# Patient Record
Sex: Male | Born: 1965
Health system: Southern US, Community
[De-identification: ages and names within clinical notes are randomized; demographics above are authoritative.]

## PROBLEM LIST (undated history)

## (undated) ENCOUNTER — Emergency Department: Discharge status: Absent without leave | Payer: BLUE CROSS/BLUE SHIELD

## (undated) DIAGNOSIS — Z973 Presence of spectacles and contact lenses: Secondary | ICD-10-CM

## (undated) DIAGNOSIS — E079 Disorder of thyroid, unspecified: Secondary | ICD-10-CM

## (undated) DIAGNOSIS — B019 Varicella without complication: Secondary | ICD-10-CM

## (undated) DIAGNOSIS — K219 Gastro-esophageal reflux disease without esophagitis: Secondary | ICD-10-CM

## (undated) DIAGNOSIS — R112 Nausea with vomiting, unspecified: Secondary | ICD-10-CM

## (undated) DIAGNOSIS — I10 Essential (primary) hypertension: Secondary | ICD-10-CM

## (undated) DIAGNOSIS — K760 Fatty (change of) liver, not elsewhere classified: Secondary | ICD-10-CM

## (undated) DIAGNOSIS — F419 Anxiety disorder, unspecified: Secondary | ICD-10-CM

## (undated) DIAGNOSIS — Z9889 Other specified postprocedural states: Secondary | ICD-10-CM

## (undated) DIAGNOSIS — Z148 Genetic carrier of other disease: Secondary | ICD-10-CM

## (undated) HISTORY — DX: Disorder of thyroid, unspecified: E07.9

## (undated) HISTORY — DX: Varicella without complication: B01.9

## (undated) HISTORY — DX: Fatty (change of) liver, not elsewhere classified: K76.0

## (undated) HISTORY — PX: WISDOM TOOTH EXTRACTION: SHX21

## (undated) HISTORY — DX: Genetic carrier of other disease: Z14.8

## (undated) HISTORY — DX: Essential (primary) hypertension: I10

---

## 2014-04-12 ENCOUNTER — Encounter: Payer: Self-pay | Admitting: Family

## 2014-04-12 ENCOUNTER — Ambulatory Visit (INDEPENDENT_AMBULATORY_CARE_PROVIDER_SITE_OTHER): Payer: BC Managed Care – PPO | Admitting: Family

## 2014-04-12 VITALS — BP 145/78 | HR 93 | Temp 98.1°F | Ht 66.0 in | Wt 188.0 lb

## 2014-04-12 DIAGNOSIS — K429 Umbilical hernia without obstruction or gangrene: Secondary | ICD-10-CM

## 2014-04-12 DIAGNOSIS — E039 Hypothyroidism, unspecified: Secondary | ICD-10-CM

## 2014-04-12 DIAGNOSIS — Z23 Encounter for immunization: Secondary | ICD-10-CM

## 2014-04-12 DIAGNOSIS — I10 Essential (primary) hypertension: Secondary | ICD-10-CM | POA: Insufficient documentation

## 2014-04-12 DIAGNOSIS — Z1283 Encounter for screening for malignant neoplasm of skin: Secondary | ICD-10-CM

## 2014-04-12 MED ORDER — FLUTICASONE PROPIONATE 50 MCG/ACT NA SUSP: 2.0000 | Freq: Every day | NASAL | Status: DC

## 2014-04-12 NOTE — Patient Instructions (Addendum)
You will be contacted about your referral to dermatology and surgery. Start flonase 2 sprays each nostril for breathing. Complete lab work prior to leaving. Schedule fasting physical at your convenience some time in the next 3 months.   Welcome to [facility]!

## 2014-04-12 NOTE — Assessment & Plan Note (Signed)
BP Readings from Last 3 Encounters:  04/12/14 145/78   BP slightly up today, will continue meds at current doses. If still above goal next visit, will need to consider adjusting meds.

## 2014-04-12 NOTE — Assessment & Plan Note (Signed)
Refer to surgeon for repair. We discussed signs/symptoms of strangulated hernia and he is advised to go to the ED if this occurs.

## 2014-04-12 NOTE — Progress Notes (Signed)
Subjective:    Patient ID: Austin Boyd, male    DOB: 31-May-1966, 48 y.o.   MRN: [id number]  HPI  Mr. [name] "Austin Boyd" is a 48 yr old male who presents today to establish care.   HTN- reports that this was diagnosed 6 years ago though was told he had htn prior to that  Hypothyroid- diagnosed about 6 years ago and has been stable on synthroid x 53mg.   Hernia- reports 2 year hx of umbilical hernia, causes him pain sometimes after he eats.  He has not met with a sPsychologist, sport and exercise    Review of Systems  Constitutional: Negative for unexpected weight change.  HENT: Positive for rhinorrhea.   Respiratory: Negative for shortness of breath.   Gastrointestinal: Negative for nausea, vomiting and diarrhea.  Genitourinary: Negative for dysuria and frequency.  Musculoskeletal: Negative for joint swelling and myalgias.  Skin: Negative for rash.  Neurological: Negative for headaches.  Hematological: Negative for adenopathy.  Psychiatric/Behavioral:       Denies depression/anxiety   Past Medical History  Diagnosis Date  . Hypertension   . Thyroid disease   . Chicken pox     History   Social History  . Marital Status: Married    Spouse Name: N/A    Number of Children: N/A  . Years of Education: N/A   Occupational History  . Not on file.   Social History Main Topics  . Smoking status: Never Smoker   . Smokeless tobacco: Not on file  . Alcohol Use: Yes     Comment: social reports 4 beers a night  . Drug Use: No  . Sexual Activity: Not on file   Other Topics Concern  . 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 No pertinent past surgical history.  Family History  Problem Relation Age of Onset  . Arthritis Mother   . Alcohol abuse Father   . Cancer Brother 40    Thyroid   . Heart  disease Maternal Grandmother   . Stroke Maternal Grandmother   . Hypertension Maternal Grandmother   . Heart disease Maternal Grandfather   . Stroke Maternal Grandfather   . Hypertension Maternal Grandfather   . Hyperlipidemia Paternal Grandmother   . Hyperlipidemia Paternal Grandfather   . Multiple sclerosis Father     died in late 518's   No Known Allergies  No current outpatient prescriptions on file prior to visit.   No current facility-administered medications on file prior to visit.    BP 145/78  Pulse 93  Temp(Src) 98.1 F (36.7 C) (Oral)  Ht _0  (1.676 m)  Wt 188 lb (85.276 kg)  BMI 30.36 kg/m2  SpO2 97%       Objective:   Physical Exam  Constitutional: He is oriented to person, place, and time. He appears well-developed and well-nourished. No distress.  HENT:  Head: Normocephalic and atraumatic.  Eyes: No scleral icterus.  Neck: No thyromegaly present.  Cardiovascular: Normal rate and regular rhythm.   No murmur heard. Pulmonary/Chest: Effort normal and breath sounds normal. No respiratory distress. He has no wheezes. He has no rales. He exhibits no tenderness.  Abdominal: Soft. Bowel sounds are normal. He exhibits no distension. There is no tenderness.  Small reducible umbilical hernia is noted  Musculoskeletal: He exhibits no edema.  Muscular appearing male  Neurological: He is alert and oriented to person, place, and time.  Skin: Skin is warm and dry.  Psychiatric: He has a normal mood and affect. His behavior is normal. Judgment and thought content normal.          Assessment & Plan:  Flu shot today.

## 2014-04-12 NOTE — Assessment & Plan Note (Signed)
Clinically stable on synthroid, continue same, obtain TSH.  

## 2014-04-12 NOTE — Progress Notes (Signed)
Pre visit review using our clinic review tool, if applicable. No additional management support is needed unless otherwise documented below in the visit note. 

## 2014-04-13 LAB — BASIC METABOLIC PANEL
BUN: 21 mg/dL (ref 6–23)
CO2: 21 mEq/L (ref 19–32)
CREATININE: 1.5 mg/dL (ref 0.4–1.5)
Calcium: 9.3 mg/dL (ref 8.4–10.5)
Chloride: 99 mEq/L (ref 96–112)
GFR: 52.21 mL/min — AB (ref >60.00)
GLUCOSE: 69 mg/dL — AB (ref 70–99)
Potassium: 3.6 mEq/L (ref 3.5–5.1)
Sodium: 137 mEq/L (ref 135–145)

## 2014-04-13 LAB — TSH: TSH: 1.62 u[IU]/mL (ref 0.35–4.50)

## 2014-04-14 ENCOUNTER — Encounter: Payer: Self-pay | Admitting: Family

## 2014-05-18 ENCOUNTER — Ambulatory Visit (INDEPENDENT_AMBULATORY_CARE_PROVIDER_SITE_OTHER): Payer: Self-pay | Admitting: Surgery

## 2014-05-18 NOTE — H&P (Signed)
History of Present Illness (Austin Boyd K. Austin Wiechman MD; 05/18/2014 11:12 AM) Patient words: umbilical hernia.  The patient is a 48 year old male who presents with an umbilical hernia. 48 yo male in good health - referred by Melissa O'Sullivan, NP for evaluation of umbilical hernia. He has had a two-year history of an enlarging umbilical hernia. Occasional discomfort. No obstructive symptoms. He presents now to discuss surgical repair. He works as a welder.   Other Problems (Sonya Bynum, CMA; 05/18/2014 10:18 AM) High blood pressure Thyroid Disease Umbilical Hernia Repair  Past Surgical History (Sonya Bynum, CMA; 05/18/2014 10:18 AM) No pertinent past surgical history  Diagnostic Studies History (Sonya Bynum, CMA; 05/18/2014 10:18 AM) Colonoscopy never  Allergies (Sonya Bynum, CMA; 05/18/2014 10:20 AM) No Known Drug Allergies12/09/2013  Medication History (Sonya Bynum, CMA; 05/18/2014 10:20 AM) Lisinopril-Hydrochlorothiazide (20-25MG Tablet, Oral) Active. AmLODIPine Besylate (2.5MG Tablet, Oral) Active. Synthroid (50MCG Tablet, Oral) Active.  Social History (Sonya Bynum, CMA; 05/18/2014 10:18 AM) Alcohol use Occasional alcohol use. Caffeine use Coffee. No drug use Tobacco use Never smoker.  Family History (Sonya Bynum, CMA; 05/18/2014 10:18 AM) Alcohol Abuse Father. Arthritis Mother. Heart disease in male family member before age 65 Thyroid problems Brother.  Review of Systems (Sonya Bynum CMA; 05/18/2014 10:18 AM) General Not Present- Appetite Loss, Chills, Fatigue, Fever, Night Sweats, Weight Gain and Weight Loss. Skin Not Present- Change in Wart/Mole, Dryness, Hives, Jaundice, New Lesions, Non-Healing Wounds, Rash and Ulcer. HEENT Present- Wears glasses/contact lenses. Not Present- Earache, Hearing Loss, Hoarseness, Nose Bleed, Oral Ulcers, Ringing in the Ears, Seasonal Allergies, Sinus Pain, Sore Throat, Visual Disturbances and Yellow Eyes. Respiratory Present-  Snoring. Not Present- Bloody sputum, Chronic Cough, Difficulty Breathing and Wheezing. Cardiovascular Not Present- Chest Pain, Difficulty Breathing Lying Down, Leg Cramps, Palpitations, Rapid Heart Rate, Shortness of Breath and Swelling of Extremities. Gastrointestinal Not Present- Abdominal Pain, Bloating, Bloody Stool, Change in Bowel Habits, Chronic diarrhea, Constipation, Difficulty Swallowing, Excessive gas, Gets full quickly at meals, Hemorrhoids, Indigestion, Nausea, Rectal Pain and Vomiting. Male Genitourinary Not Present- Blood in Urine, Change in Urinary Stream, Frequency, Impotence, Nocturia, Painful Urination, Urgency and Urine Leakage. Musculoskeletal Not Present- Back Pain, Joint Pain, Joint Stiffness, Muscle Pain, Muscle Weakness and Swelling of Extremities. Neurological Not Present- Decreased Memory, Fainting, Headaches, Numbness, Seizures, Tingling, Tremor, Trouble walking and Weakness. Psychiatric Not Present- Anxiety, Bipolar, Change in Sleep Pattern, Depression, Fearful and Frequent crying. Endocrine Not Present- Cold Intolerance, Excessive Hunger, Hair Changes, Heat Intolerance, Hot flashes and New Diabetes. Hematology Not Present- Easy Bruising, Excessive bleeding, Gland problems, HIV and Persistent Infections.   Vitals (Sonya Bynum CMA; 05/18/2014 10:19 AM) 05/18/2014 10:19 AM Weight: 186 lb Height: 66in Body Surface Area: 1.98 m Body Mass Index: 30.02 kg/m Temp.: 97.5F(Temporal)  Pulse: 78 (Regular)  BP: 134/80 (Sitting, Left Arm, Standard)    Physical Exam (Lothar Prehn K. Ariyan Brisendine MD; 05/18/2014 11:13 AM) The physical exam findings are as follows: Note:WDWN in NAD HEENT: EOMI, sclera anicteric Neck: No masses, no thyromegaly Lungs: CTA bilaterally; normal respiratory effort CV: Regular rate and rhythm; no murmurs Abd: +bowel sounds, soft, non-tender, protruding umbilcal hernia - incarcerated, but not inflamed or tender; probably contains omentum Ext:  Well-perfused; no edema Skin: Warm, dry; no sign of jaundice    Assessment & Plan (Jovi Zavadil K. Nirali Magouirk MD; 05/18/2014 10:45 AM) UMBILICAL HERNIA, INCARCERATED (552.1  K42.0) Current Plans  Schedule for Surgery - umbilical hernia repair with mesh. The surgical procedure has been discussed with the patient. Potential risks, benefits, alternative treatments, and   expected outcomes have been explained. All of the patient's questions at this time have been answered. The likelihood of reaching the patient's treatment goal is good. The patient understand the proposed surgical procedure and wishes to proceed.   Romone Shaff K. Eyoel Throgmorton, MD, FACS [facility]  General/ Trauma Surgery  05/18/2014 1:42 PM   

## 2014-06-11 ENCOUNTER — Encounter (HOSPITAL_BASED_OUTPATIENT_CLINIC_OR_DEPARTMENT_OTHER)
Admission: RE | Admit: 2014-06-11 | Discharge: 2014-06-11 | Disposition: A | Discharge status: Home / Self Care | Payer: BC Managed Care – PPO | Source: Ambulatory Visit | Attending: Surgery | Admitting: Surgery

## 2014-06-11 ENCOUNTER — Encounter (HOSPITAL_BASED_OUTPATIENT_CLINIC_OR_DEPARTMENT_OTHER): Payer: Self-pay | Admitting: *Deleted

## 2014-06-11 DIAGNOSIS — E079 Disorder of thyroid, unspecified: Secondary | ICD-10-CM | POA: Diagnosis not present

## 2014-06-11 DIAGNOSIS — K429 Umbilical hernia without obstruction or gangrene: Secondary | ICD-10-CM | POA: Diagnosis present

## 2014-06-11 DIAGNOSIS — I10 Essential (primary) hypertension: Secondary | ICD-10-CM | POA: Diagnosis not present

## 2014-06-11 LAB — BASIC METABOLIC PANEL
ANION GAP: 6 (ref 5–15)
BUN: 16 mg/dL (ref 6–23)
CALCIUM: 9.5 mg/dL (ref 8.4–10.5)
CO2: 31 mmol/L (ref 19–32)
Chloride: 102 mEq/L (ref 96–112)
Creatinine, Ser: 1.53 mg/dL — ABNORMAL HIGH (ref 0.50–1.35)
GFR calc Af Amer: 60 mL/min — ABNORMAL LOW (ref >90)
GFR, EST NON AFRICAN AMERICAN: 52 mL/min — AB (ref >90)
Glucose, Bld: 99 mg/dL (ref 70–99)
POTASSIUM: 4.5 mmol/L (ref 3.5–5.1)
SODIUM: 139 mmol/L (ref 135–145)

## 2014-06-11 NOTE — Progress Notes (Signed)
To come in for ekg-bmet-wife states he is fearful needles May get a preop sedative dos

## 2014-06-13 ENCOUNTER — Ambulatory Visit (HOSPITAL_BASED_OUTPATIENT_CLINIC_OR_DEPARTMENT_OTHER): Payer: BC Managed Care – PPO | Admitting: Anesthesiology

## 2014-06-13 ENCOUNTER — Ambulatory Visit (HOSPITAL_BASED_OUTPATIENT_CLINIC_OR_DEPARTMENT_OTHER)
Admission: RE | Admit: 2014-06-13 | Discharge: 2014-06-13 | Disposition: A | Discharge status: Home / Self Care | Payer: BC Managed Care – PPO | Source: Ambulatory Visit | Attending: Surgery | Admitting: Surgery

## 2014-06-13 ENCOUNTER — Encounter (HOSPITAL_BASED_OUTPATIENT_CLINIC_OR_DEPARTMENT_OTHER): Payer: Self-pay | Admitting: *Deleted

## 2014-06-13 ENCOUNTER — Encounter (HOSPITAL_BASED_OUTPATIENT_CLINIC_OR_DEPARTMENT_OTHER)
Admission: RE | Disposition: A | Discharge status: Home / Self Care | Payer: Self-pay | Source: Ambulatory Visit | Attending: Surgery

## 2014-06-13 DIAGNOSIS — K429 Umbilical hernia without obstruction or gangrene: Secondary | ICD-10-CM | POA: Diagnosis not present

## 2014-06-13 DIAGNOSIS — I10 Essential (primary) hypertension: Secondary | ICD-10-CM | POA: Insufficient documentation

## 2014-06-13 DIAGNOSIS — E079 Disorder of thyroid, unspecified: Secondary | ICD-10-CM | POA: Insufficient documentation

## 2014-06-13 HISTORY — DX: Nausea with vomiting, unspecified: R11.2

## 2014-06-13 HISTORY — DX: Anxiety disorder, unspecified: F41.9

## 2014-06-13 HISTORY — PX: UMBILICAL HERNIA REPAIR: SHX196

## 2014-06-13 HISTORY — DX: Presence of spectacles and contact lenses: Z97.3

## 2014-06-13 HISTORY — DX: Nausea with vomiting, unspecified: Z98.890

## 2014-06-13 HISTORY — PX: INSERTION OF MESH: SHX5868

## 2014-06-13 HISTORY — DX: Gastro-esophageal reflux disease without esophagitis: K21.9

## 2014-06-13 LAB — POCT HEMOGLOBIN-HEMACUE: HEMOGLOBIN: 16.8 g/dL (ref 13.0–17.0)

## 2014-06-13 SURGERY — REPAIR, HERNIA, UMBILICAL, ADULT
Anesthesia: General | Site: Abdomen

## 2014-06-13 MED ORDER — FENTANYL CITRATE 0.05 MG/ML IJ SOLN
INTRAMUSCULAR | Status: AC
  Filled 2014-06-13: qty 4

## 2014-06-13 MED ORDER — HYDROMORPHONE HCL 1 MG/ML IJ SOLN
0.2500 mg | INTRAMUSCULAR | Status: DC | PRN
  Administered 2014-06-13: 0.5 mg via INTRAVENOUS

## 2014-06-13 MED ORDER — CHLORHEXIDINE GLUCONATE 4 % EX LIQD: 1.0000 "application " | Freq: Once | CUTANEOUS | Status: DC

## 2014-06-13 MED ORDER — LACTATED RINGERS IV SOLN
INTRAVENOUS | Status: DC
  Administered 2014-06-13 (×2): via INTRAVENOUS
  Administered 2014-06-13: 10 mL/h via INTRAVENOUS

## 2014-06-13 MED ORDER — HYDROMORPHONE HCL 1 MG/ML IJ SOLN
INTRAMUSCULAR | Status: AC
  Filled 2014-06-13: qty 1

## 2014-06-13 MED ORDER — BUPIVACAINE-EPINEPHRINE 0.25% -1:200000 IJ SOLN
INTRAMUSCULAR | Status: DC | PRN
  Administered 2014-06-13: 10 mL

## 2014-06-13 MED ORDER — ONDANSETRON HCL 4 MG/2ML IJ SOLN
INTRAMUSCULAR | Status: DC | PRN
  Administered 2014-06-13: 4 mg via INTRAVENOUS

## 2014-06-13 MED ORDER — FENTANYL CITRATE 0.05 MG/ML IJ SOLN
INTRAMUSCULAR | Status: DC | PRN
  Administered 2014-06-13: 50 ug via INTRAVENOUS
  Administered 2014-06-13 (×2): 25 ug via INTRAVENOUS
  Administered 2014-06-13: 100 ug via INTRAVENOUS

## 2014-06-13 MED ORDER — DEXAMETHASONE SODIUM PHOSPHATE 4 MG/ML IJ SOLN
INTRAMUSCULAR | Status: DC | PRN
  Administered 2014-06-13: 10 mg via INTRAVENOUS

## 2014-06-13 MED ORDER — CEFAZOLIN SODIUM 1-5 GM-% IV SOLN
INTRAVENOUS | Status: AC
  Filled 2014-06-13: qty 100

## 2014-06-13 MED ORDER — MIDAZOLAM HCL 5 MG/5ML IJ SOLN
INTRAMUSCULAR | Status: DC | PRN
  Administered 2014-06-13: 2 mg via INTRAVENOUS

## 2014-06-13 MED ORDER — ONDANSETRON HCL 4 MG/2ML IJ SOLN: 4.0000 mg | INTRAMUSCULAR | Status: DC | PRN

## 2014-06-13 MED ORDER — OXYCODONE-ACETAMINOPHEN 5-325 MG PO TABS
ORAL_TABLET | ORAL | Status: AC
  Filled 2014-06-13: qty 1

## 2014-06-13 MED ORDER — OXYCODONE-ACETAMINOPHEN 5-325 MG PO TABS: 1.0000 | ORAL_TABLET | ORAL | Status: DC | PRN

## 2014-06-13 MED ORDER — MIDAZOLAM HCL 2 MG/2ML IJ SOLN
INTRAMUSCULAR | Status: AC
  Filled 2014-06-13: qty 2

## 2014-06-13 MED ORDER — MORPHINE SULFATE 2 MG/ML IJ SOLN: 2.0000 mg | INTRAMUSCULAR | Status: DC | PRN

## 2014-06-13 MED ORDER — MIDAZOLAM HCL 2 MG/2ML IJ SOLN: 1.0000 mg | INTRAMUSCULAR | Status: DC | PRN

## 2014-06-13 MED ORDER — LIDOCAINE HCL (CARDIAC) 20 MG/ML IV SOLN
INTRAVENOUS | Status: DC | PRN
  Administered 2014-06-13: 80 mg via INTRAVENOUS

## 2014-06-13 MED ORDER — CEFAZOLIN SODIUM-DEXTROSE 2-3 GM-% IV SOLR
2.0000 g | INTRAVENOUS | Status: AC
  Administered 2014-06-13: 2 g via INTRAVENOUS

## 2014-06-13 MED ORDER — PROMETHAZINE HCL 25 MG/ML IJ SOLN: 6.2500 mg | INTRAMUSCULAR | Status: DC | PRN

## 2014-06-13 MED ORDER — PROPOFOL 10 MG/ML IV BOLUS
INTRAVENOUS | Status: DC | PRN
  Administered 2014-06-13: 200 mg via INTRAVENOUS

## 2014-06-13 MED ORDER — FENTANYL CITRATE 0.05 MG/ML IJ SOLN: 50.0000 ug | INTRAMUSCULAR | Status: DC | PRN

## 2014-06-13 MED ORDER — OXYCODONE-ACETAMINOPHEN 5-325 MG PO TABS
1.0000 | ORAL_TABLET | ORAL | Status: DC | PRN
  Administered 2014-06-13: 1 via ORAL

## 2014-06-13 SURGICAL SUPPLY — 62 items
APPLICATOR COTTON TIP 6IN STRL (MISCELLANEOUS) IMPLANT
BENZOIN TINCTURE PRP APPL 2/3 (GAUZE/BANDAGES/DRESSINGS) ×3 IMPLANT
BLADE CLIPPER SURG (BLADE) ×3 IMPLANT
BLADE HEX COATED 2.75 (ELECTRODE) ×3 IMPLANT
BLADE SURG 15 STRL LF DISP TIS (BLADE) ×1 IMPLANT
BLADE SURG 15 STRL SS (BLADE) ×2
CANISTER SUCT 1200ML W/VALVE (MISCELLANEOUS) IMPLANT
CHLORAPREP W/TINT 26ML (MISCELLANEOUS) ×3 IMPLANT
CLOSURE WOUND 1/2 X4 (GAUZE/BANDAGES/DRESSINGS) ×1
COVER BACK TABLE 60X90IN (DRAPES) ×3 IMPLANT
COVER MAYO STAND STRL (DRAPES) ×3 IMPLANT
DECANTER SPIKE VIAL GLASS SM (MISCELLANEOUS) IMPLANT
DRAPE LAPAROTOMY T 102X78X121 (DRAPES) IMPLANT
DRAPE PED LAPAROTOMY (DRAPES) ×3 IMPLANT
DRAPE UTILITY XL STRL (DRAPES) ×3 IMPLANT
DRSG TEGADERM 4X4.75 (GAUZE/BANDAGES/DRESSINGS) ×3 IMPLANT
ELECT REM PT RETURN 9FT ADLT (ELECTROSURGICAL) ×3
ELECTRODE REM PT RTRN 9FT ADLT (ELECTROSURGICAL) ×1 IMPLANT
GLOVE BIO SURGEON STRL SZ7 (GLOVE) ×3 IMPLANT
GLOVE BIOGEL PI IND STRL 7.0 (GLOVE) ×2 IMPLANT
GLOVE BIOGEL PI IND STRL 7.5 (GLOVE) ×1 IMPLANT
GLOVE BIOGEL PI INDICATOR 7.0 (GLOVE) ×4
GLOVE BIOGEL PI INDICATOR 7.5 (GLOVE) ×2
GLOVE ECLIPSE 6.5 STRL STRAW (GLOVE) ×3 IMPLANT
GLOVE EXAM NITRILE LRG STRL (GLOVE) ×3 IMPLANT
GOWN STRL REUS W/ TWL LRG LVL3 (GOWN DISPOSABLE) ×3 IMPLANT
GOWN STRL REUS W/TWL LRG LVL3 (GOWN DISPOSABLE) ×6
MESH VENTRALEX ST 1-7/10 CRC S (Mesh General) ×3 IMPLANT
NDL SAFETY ECLIPSE 18X1.5 (NEEDLE) IMPLANT
NEEDLE HYPO 18GX1.5 SHARP (NEEDLE)
NEEDLE HYPO 25X1 1.5 SAFETY (NEEDLE) ×3 IMPLANT
NS IRRIG 1000ML POUR BTL (IV SOLUTION) IMPLANT
PACK BASIN DAY SURGERY FS (CUSTOM PROCEDURE TRAY) ×3 IMPLANT
PENCIL BUTTON HOLSTER BLD 10FT (ELECTRODE) ×3 IMPLANT
SLEEVE SCD COMPRESS KNEE MED (MISCELLANEOUS) ×3 IMPLANT
SPONGE GAUZE 2X2 8PLY STER LF (GAUZE/BANDAGES/DRESSINGS) ×1
SPONGE GAUZE 2X2 8PLY STRL LF (GAUZE/BANDAGES/DRESSINGS) ×2 IMPLANT
SPONGE GAUZE 4X4 12PLY STER LF (GAUZE/BANDAGES/DRESSINGS) IMPLANT
STAPLER VISISTAT 35W (STAPLE) IMPLANT
STRIP CLOSURE SKIN 1/2X4 (GAUZE/BANDAGES/DRESSINGS) ×2 IMPLANT
SUT MNCRL AB 4-0 PS2 18 (SUTURE) ×3 IMPLANT
SUT NOVA NAB DX-16 0-1 5-0 T12 (SUTURE) ×3 IMPLANT
SUT NOVA NAB GS-21 0 18 T12 DT (SUTURE) ×3 IMPLANT
SUT PDS AB 0 CT 36 (SUTURE) IMPLANT
SUT PROLENE 0 CT 1 30 (SUTURE) IMPLANT
SUT PROLENE 0 CT 1 CR/8 (SUTURE) IMPLANT
SUT SILK 3 0 TIES 17X18 (SUTURE)
SUT SILK 3-0 18XBRD TIE BLK (SUTURE) IMPLANT
SUT VIC AB 2-0 CT1 27 (SUTURE)
SUT VIC AB 2-0 CT1 TAPERPNT 27 (SUTURE) IMPLANT
SUT VIC AB 3-0 SH 27 (SUTURE) ×2
SUT VIC AB 3-0 SH 27X BRD (SUTURE) ×1 IMPLANT
SUT VIC AB 4-0 BRD 54 (SUTURE) IMPLANT
SUT VIC AB 4-0 SH 18 (SUTURE) IMPLANT
SUT VICRYL 4-0 PS2 18IN ABS (SUTURE) IMPLANT
SYR BULB 3OZ (MISCELLANEOUS) IMPLANT
SYR CONTROL 10ML LL (SYRINGE) ×3 IMPLANT
TOWEL OR 17X24 6PK STRL BLUE (TOWEL DISPOSABLE) ×3 IMPLANT
TOWEL OR NON WOVEN STRL DISP B (DISPOSABLE) IMPLANT
TUBE CONNECTING 20'X1/4 (TUBING)
TUBE CONNECTING 20X1/4 (TUBING) IMPLANT
YANKAUER SUCT BULB TIP NO VENT (SUCTIONS) IMPLANT

## 2014-06-13 NOTE — Op Note (Signed)
Indications:  The patient presented with a history of an enlarging, non-reducible umbilical hernia.  The patient was examined and we recommended umbilical hernia repair with mesh.  Pre-operative diagnosis:  Umbilical hernia  Post-operative diagnosis:  Same  Surgeon: Alexi Dorminey K.   Assistants: none  Anesthesia: General LMA anesthesia  ASA Class: 1   Procedure Details  The patient was seen again in the Holding Room. The risks, benefits, complications, treatment options, and expected outcomes were discussed with the patient. The possibilities of reaction to medication, pulmonary aspiration, perforation of viscus, bleeding, recurrent infection, the need for additional procedures, and development of a complication requiring transfusion or further operation were discussed with the patient and/or family. There was concurrence with the proposed plan, and informed consent was obtained. The site of surgery was properly noted/marked. The patient was taken to the Operating Room, identified as Austin Boyd, and the procedure verified as umbilical hernia repair. A Time Out was held and the above information confirmed.  After an adequate level of general anesthesia was obtained, the patient's abdomen was prepped with Chloraprep and draped in sterile fashion.  We made a transverse incision above the umbilicus.  Dissection was carried down to the hernia sac with cautery.  We dissected bluntly around the hernia sac down to the edge of the fascial defect.  We reduced the hernia sac back into the pre-peritoneal space.  The fascial defect measured 1.5 cm.  We cleared the fascia in all directions.  A small Ventralex mesh was inserted into the pre-peritoneal space and was deployed.  The mesh was secured with four trans-fascial sutures of 0 Novofil.  The fascial defect was closed with multiple interrupted figure-of-eight 1 Novofil sutures.  The base of the umbilicus was tacked down with 3-0 Vicryl.  3-0 Vicryl was  used to close the subcutaneous tissues and 4-0 Monocryl was used to close the skin.  Steri-strips and clean dressing were applied.  The patient was extubated and brought to the recovery room in stable condition.  All sponge, instrument, and needle counts were correct prior to closure and at the conclusion of the case.   Estimated Blood Loss: Minimal          Complications: None; patient tolerated the procedure well.         Disposition: PACU - hemodynamically stable.         Condition: stable   Austin Burn. Georgette Dover, MD, [facility]  General/ Trauma Surgery  06/13/2014 11:45 AM

## 2014-06-13 NOTE — Anesthesia Preprocedure Evaluation (Signed)
Anesthesia Evaluation  Patient identified by MRN, date of birth, ID band Patient awake    Reviewed: Allergy & Precautions, H&P , NPO status , Patient's Chart, lab work & pertinent test results  History of Anesthesia Complications (+) PONV  Airway    Neck ROM: Full    Dental  (+) Teeth Intact   Pulmonary neg pulmonary ROS,  breath sounds clear to auscultation        Cardiovascular hypertension, Rhythm:Regular     Neuro/Psych negative neurological ROS     GI/Hepatic GERD-  ,  Endo/Other  Hypothyroidism   Renal/GU      Musculoskeletal   Abdominal   Peds  Hematology   Anesthesia Other Findings   Reproductive/Obstetrics                             Anesthesia Physical Anesthesia Plan  ASA: II  Anesthesia Plan: General   Post-op Pain Management:    Induction: Intravenous  Airway Management Planned: LMA  Additional Equipment:   Intra-op Plan:   Post-operative Plan: Extubation in OR  Informed Consent: I have reviewed the patients History and Physical, chart, labs and discussed the procedure including the risks, benefits and alternatives for the proposed anesthesia with the patient or authorized representative who has indicated his/her understanding and acceptance.   Dental advisory given  Plan Discussed with: CRNA and Surgeon  Anesthesia Plan Comments:         Anesthesia Quick Evaluation

## 2014-06-13 NOTE — Discharge Instructions (Signed)
[facility] ° °UMBILICAL HERNIA REPAIR: POST OP INSTRUCTIONS ° °Always review your discharge instruction sheet given to you by the facility where your surgery was performed. °IF YOU HAVE DISABILITY OR FAMILY LEAVE FORMS, YOU MUST BRING THEM TO THE OFFICE FOR PROCESSING.   °DO NOT GIVE THEM TO YOUR DOCTOR. ° °1. A  prescription for pain medication may be given to you upon discharge.  Take your pain medication as prescribed, if needed.  If narcotic pain medicine is not needed, then you may take acetaminophen (Tylenol) or ibuprofen (Advil) as needed. °2. Take your usually prescribed medications unless otherwise directed. °3. If you need a refill on your pain medication, please contact your pharmacy.  They will contact our office to request authorization. Prescriptions will not be filled after 5 pm or on week-ends. °4. You should follow a light diet the first 24 hours after arrival home, such as soup and crackers, etc.  Be sure to include lots of fluids daily.  Resume your normal diet the day after surgery. °5. Most patients will experience some swelling and bruising around the umbilicus or in the groin and scrotum.  Ice packs and reclining will help.  Swelling and bruising can take several days to resolve.  °6. It is common to experience some constipation if taking pain medication after surgery.  Increasing fluid intake and taking a stool softener (such as Colace) will usually help or prevent this problem from occurring.  A mild laxative (Milk of Magnesia or Miralax) should be taken according to package directions if there are no bowel movements after 48 hours. °7. Unless discharge instructions indicate otherwise, you may remove your bandages 24-48 hours after surgery, and you may shower at that time.  You will have steri-strips (small skin tapes) in place directly over the incision.  These strips should be left on the skin for 7-10 days. °8. ACTIVITIES:  You may resume regular (light) daily activities  beginning the next day--such as daily self-care, walking, climbing stairs--gradually increasing activities as tolerated.  You may have sexual intercourse when it is comfortable.  Refrain from any heavy lifting or straining until approved by your doctor. °a. You may drive when you are no longer taking prescription pain medication, you can comfortably wear a seatbelt, and you can safely maneuver your car and apply brakes. °b. RETURN TO WORK:  2-3 weeks with light duty - no lifting over 15 lbs. °9. You should see your doctor in the office for a follow-up appointment approximately 2-3 weeks after your surgery.  Make sure that you call for this appointment within a day or two after you arrive home to insure a convenient appointment time. °10. OTHER INSTRUCTIONS:  __________________________________________________________________________________________________________________________________________________________________________________________  °WHEN TO CALL YOUR DOCTOR: °1. Fever over 101.0 °2. Inability to urinate °3. Nausea and/or vomiting °4. Extreme swelling or bruising °5. Continued bleeding from incision. °6. Increased pain, redness, or drainage from the incision ° °The clinic staff is available to answer your questions during regular business hours.  Please don’t hesitate to call and ask to speak to one of the nurses for clinical concerns.  If you have a medical emergency, go to the nearest emergency room or call 911.  A surgeon from [facility] is always on call at the hospital ° ° °[address], [address], [place], [place]  [address] ? ° [address] [address], [place], [place]   [address] °[phone]    [phone]    FAX [phone] °Web site: [email or web address] ° ° °  Post Anesthesia Home Care Instructions ° °Activity: °Get plenty of rest for the remainder of the day. A responsible adult should stay with you for 24 hours following the procedure.  °For the next 24 hours, DO  NOT: °-Drive a car °-Operate machinery °-Drink alcoholic beverages °-Take any medication unless instructed by your physician °-Make any legal decisions or sign important papers. ° °Meals: °Start with liquid foods such as gelatin or soup. Progress to regular foods as tolerated. Avoid greasy, spicy, heavy foods. If nausea and/or vomiting occur, drink only clear liquids until the nausea and/or vomiting subsides. Call your physician if vomiting continues. ° °Special Instructions/Symptoms: °Your throat may feel dry or sore from the anesthesia or the breathing tube placed in your throat during surgery. If this causes discomfort, gargle with warm salt water. The discomfort should disappear within 24 hours. ° °

## 2014-06-13 NOTE — Interval H&P Note (Signed)
History and Physical Interval Note:  06/13/2014 8:38 AM  Austin Boyd  has presented today for surgery, with the diagnosis of Umbilical Hernia  The various methods of treatment have been discussed with the patient and family. After consideration of risks, benefits and other options for treatment, the patient has consented to  Procedure(s): HERNIA REPAIR UMBILICAL ADULT (N/A) INSERTION OF MESH (N/A) as a surgical intervention .  The patient's history has been reviewed, patient examined, no change in status, stable for surgery.  I have reviewed the patient's chart and labs.  Questions were answered to the patient's satisfaction.     [name] K.

## 2014-06-13 NOTE — Transfer of Care (Signed)
Immediate Anesthesia Transfer of Care Note  Patient: Austin Boyd  Procedure(s) Performed: Procedure(s): HERNIA REPAIR UMBILICAL ADULT (N/A) INSERTION OF MESH (N/A)  Patient Location: PACU  Anesthesia Type:General  Level of Consciousness: sedated  Airway & Oxygen Therapy: Patient Spontanous Breathing and Patient connected to face mask oxygen  Post-op Assessment: Report given to PACU RN and Post -op Vital signs reviewed and stable  Post vital signs: Reviewed and stable  Complications: No apparent anesthesia complications

## 2014-06-13 NOTE — Anesthesia Procedure Notes (Signed)
Procedure Name: LMA Insertion Date/Time: 06/13/2014 11:06 AM Performed by: Maryella Shivers Pre-anesthesia Checklist: Patient identified, Emergency Drugs available, Suction available and Patient being monitored Patient Re-evaluated:Patient Re-evaluated prior to inductionOxygen Delivery Method: Circle System Utilized Preoxygenation: Pre-oxygenation with 100% oxygen Intubation Type: IV induction Ventilation: Mask ventilation without difficulty LMA: LMA inserted LMA Size: 5.0 Number of attempts: 1 Airway Equipment and Method: bite block Placement Confirmation: positive ETCO2 Tube secured with: Tape Dental Injury: Teeth and Oropharynx as per pre-operative assessment

## 2014-06-13 NOTE — H&P (View-Only) (Signed)
History of Present Illness Austin Boyd. Charnell Peplinski MD; 05/18/2014 11:12 AM) Patient words: umbilical hernia.  The patient is a 48 year old male who presents with an umbilical hernia. 48 yo male in good health - referred by Debbrah Alar, NP for evaluation of umbilical hernia. He has had a two-year history of an enlarging umbilical hernia. Occasional discomfort. No obstructive symptoms. He presents now to discuss surgical repair. He works as a Building control surveyor.   Other Problems Davy Pique Bynum, CMA; 05/18/2014 10:18 AM) High blood pressure Thyroid Disease Umbilical Hernia Repair  Past Surgical History Marjean Donna, CMA; 05/18/2014 10:18 AM) No pertinent past surgical history  Diagnostic Studies History Marjean Donna, CMA; 05/18/2014 10:18 AM) Colonoscopy never  Allergies (Sonya Bynum, CMA; 05/18/2014 10:20 AM) No Known Drug Allergies12/09/2013  Medication History (Sonya Bynum, CMA; 05/18/2014 10:20 AM) Lisinopril-Hydrochlorothiazide (20-25MG  Tablet, Oral) Active. AmLODIPine Besylate (2.5MG  Tablet, Oral) Active. Synthroid (50MCG Tablet, Oral) Active.  Social History ([place]; 05/18/2014 10:18 AM) Alcohol use Occasional alcohol use. Caffeine use Coffee. No drug use Tobacco use Never smoker.  Family History Marjean Donna, CMA; 05/18/2014 10:18 AM) Alcohol Abuse Father. Arthritis Mother. Heart disease in male family member before age 25 Thyroid problems Brother.  Review of Systems Davy Pique Bynum CMA; 05/18/2014 10:18 AM) General Not Present- Appetite Loss, Chills, Fatigue, Fever, Night Sweats, Weight Gain and Weight Loss. Skin Not Present- Change in Wart/Mole, Dryness, Hives, Jaundice, New Lesions, Non-Healing Wounds, Rash and Ulcer. HEENT Present- Wears glasses/contact lenses. Not Present- Earache, Hearing Loss, Hoarseness, Nose Bleed, Oral Ulcers, Ringing in the Ears, Seasonal Allergies, Sinus Pain, Sore Throat, Visual Disturbances and Yellow Eyes. Respiratory Present-  Snoring. Not Present- Bloody sputum, Chronic Cough, Difficulty Breathing and Wheezing. Cardiovascular Not Present- Chest Pain, Difficulty Breathing Lying Down, Leg Cramps, Palpitations, Rapid Heart Rate, Shortness of Breath and Swelling of Extremities. Gastrointestinal Not Present- Abdominal Pain, Bloating, Bloody Stool, Change in Bowel Habits, Chronic diarrhea, Constipation, Difficulty Swallowing, Excessive gas, Gets full quickly at meals, Hemorrhoids, Indigestion, Nausea, Rectal Pain and Vomiting. Male Genitourinary Not Present- Blood in Urine, Change in Urinary Stream, Frequency, Impotence, Nocturia, Painful Urination, Urgency and Urine Leakage. Musculoskeletal Not Present- Back Pain, Joint Pain, Joint Stiffness, Muscle Pain, Muscle Weakness and Swelling of Extremities. Neurological Not Present- Decreased Memory, Fainting, Headaches, Numbness, Seizures, Tingling, Tremor, Trouble walking and Weakness. Psychiatric Not Present- Anxiety, Bipolar, Change in Sleep Pattern, Depression, Fearful and Frequent crying. Endocrine Not Present- Cold Intolerance, Excessive Hunger, Hair Changes, Heat Intolerance, Hot flashes and New Diabetes. Hematology Not Present- Easy Bruising, Excessive bleeding, Gland problems, HIV and Persistent Infections.   Vitals (Sonya Bynum CMA; 05/18/2014 10:19 AM) 05/18/2014 10:19 AM Weight: 186 lb Height: 66in Body Surface Area: 1.98 m Body Mass Index: 30.02 kg/m Temp.: 97.52F(Temporal)  Pulse: 78 (Regular)  BP: 134/80 (Sitting, Left Arm, Standard)    Physical Exam Rodman Key K. Deepti Gunawan MD; 05/18/2014 11:13 AM) The physical exam findings are as follows: Note:WDWN in NAD HEENT: EOMI, sclera anicteric Neck: No masses, no thyromegaly Lungs: CTA bilaterally; normal respiratory effort CV: Regular rate and rhythm; no murmurs Abd: +bowel sounds, soft, non-tender, protruding umbilcal hernia - incarcerated, but not inflamed or tender; probably contains omentum Ext:  Well-perfused; no edema Skin: Warm, dry; no sign of jaundice    Assessment & Plan Rodman Key K. Greysen Swanton MD; 28/08/6627 47:65 AM) UMBILICAL HERNIA, INCARCERATED (552.1  K42.0) Current Plans  Schedule for Surgery - umbilical hernia repair with mesh. The surgical procedure has been discussed with the patient. Potential risks, benefits, alternative treatments, and  expected outcomes have been explained. All of the patient's questions at this time have been answered. The likelihood of reaching the patient's treatment goal is good. The patient understand the proposed surgical procedure and wishes to proceed.   Austin Boyd. Georgette Dover, MD, [facility]  General/ Trauma Surgery  05/18/2014 1:42 PM

## 2014-06-13 NOTE — Anesthesia Postprocedure Evaluation (Signed)
  Anesthesia Post-op Note  Patient: Austin Boyd  Procedure(s) Performed: Procedure(s): HERNIA REPAIR UMBILICAL ADULT (N/A) INSERTION OF MESH (N/A)  Patient Location: PACU  Anesthesia Type: General   Level of Consciousness: awake, alert  and oriented  Airway and Oxygen Therapy: Patient Spontanous Breathing  Post-op Pain: mild  Post-op Assessment: Post-op Vital signs reviewed  Post-op Vital Signs: Reviewed  Last Vitals:  Filed Vitals:   06/13/14 1345  BP: 149/91  Pulse: 68  Temp: 36.7 C  Resp: 16    Complications: No apparent anesthesia complications

## 2014-09-19 ENCOUNTER — Telehealth: Payer: Self-pay | Admitting: Family

## 2014-09-19 NOTE — Telephone Encounter (Signed)
Caller name: Rulon Relation to pt: self Call back number: [phone] Pharmacy: [facility]  Reason for call:   Patient is requesting lisinopril, amlodipine, synthroid refills. 90 day supply

## 2014-09-20 NOTE — Telephone Encounter (Signed)
Patient last seen 04/12/14 and note says return in 3 months for CPE.   Please see below and advise.

## 2014-09-21 MED ORDER — LISINOPRIL-HYDROCHLOROTHIAZIDE 20-25 MG PO TABS: 1.0000 | ORAL_TABLET | Freq: Every day | ORAL | Status: DC

## 2014-09-21 MED ORDER — LEVOTHYROXINE SODIUM 50 MCG PO TABS: 50.0000 ug | ORAL_TABLET | Freq: Every day | ORAL | Status: DC

## 2014-09-21 MED ORDER — AMLODIPINE BESYLATE 2.5 MG PO TABS: 2.5000 mg | ORAL_TABLET | Freq: Every day | ORAL | Status: DC

## 2014-09-21 NOTE — Telephone Encounter (Signed)
Patient wife called back stating that patient is now out. Requesting emergency supply of all meds to be sent to [facility] [address] in [facility].

## 2014-09-21 NOTE — Telephone Encounter (Signed)
30 day supply sent to pharmacy as pt's BP was slightly elevated last visit and he was advised to return in 3 months for CPE and BP recheck. Notified pt's spouse and she will have him check his calendar and call back to schedule appt.

## 2014-09-27 ENCOUNTER — Telehealth: Payer: Self-pay | Admitting: Family

## 2014-09-27 NOTE — Telephone Encounter (Signed)
Pre visit letter sent  °

## 2014-10-15 ENCOUNTER — Ambulatory Visit (INDEPENDENT_AMBULATORY_CARE_PROVIDER_SITE_OTHER): Payer: BLUE CROSS/BLUE SHIELD | Admitting: Family

## 2014-10-15 ENCOUNTER — Encounter: Payer: Self-pay | Admitting: Family

## 2014-10-15 VITALS — BP 146/90 | HR 86 | Temp 98.5°F | Resp 16 | Ht 66.0 in | Wt 187.4 lb

## 2014-10-15 DIAGNOSIS — Z0000 Encounter for general adult medical examination without abnormal findings: Secondary | ICD-10-CM | POA: Diagnosis not present

## 2014-10-15 DIAGNOSIS — Z23 Encounter for immunization: Secondary | ICD-10-CM

## 2014-10-15 DIAGNOSIS — I10 Essential (primary) hypertension: Secondary | ICD-10-CM

## 2014-10-15 MED ORDER — LEVOTHYROXINE SODIUM 50 MCG PO TABS: 50.0000 ug | ORAL_TABLET | Freq: Every day | ORAL | Status: DC

## 2014-10-15 MED ORDER — AMLODIPINE BESYLATE 5 MG PO TABS: 5.0000 mg | ORAL_TABLET | Freq: Every day | ORAL | Status: DC

## 2014-10-15 MED ORDER — LISINOPRIL-HYDROCHLOROTHIAZIDE 20-25 MG PO TABS: 1.0000 | ORAL_TABLET | Freq: Every day | ORAL | Status: DC

## 2014-10-15 NOTE — Patient Instructions (Signed)
Please schedule lab visit at the front desk. Try to add 30 min of cardio 5 days a week. Increase amlodipine from 2.5 mg to 5 mg once daily. Follow up in 1 month so we can recheck  Your blood pressure.

## 2014-10-15 NOTE — Progress Notes (Signed)
Subjective:    Patient ID: Austin Boyd, male    DOB: Oct 27, 1965, 49 y.o.   MRN: [id number]  HPI  Austin Boyd is a 49 yr old male who presents today for cpx.   Immunizations: due for tetanus Diet:  Reports healthy diet.  Exercise: reports that he works out daily- no cardio g Dental: up to date Eye: had this year  HTN- reports + compliance with amlodipine.  BP Readings from Last 3 Encounters:  10/15/14 146/90  06/13/14 149/91  04/12/14 145/78      Review of Systems  Constitutional: Negative for unexpected weight change.  HENT: Positive for rhinorrhea. Negative for hearing loss.   Respiratory: Negative for cough and shortness of breath.   Cardiovascular: Negative for chest pain and leg swelling.  Gastrointestinal: Negative for nausea, vomiting and diarrhea.  Genitourinary: Negative for dysuria and frequency.  Musculoskeletal: Negative for joint swelling and arthralgias.  Skin: Negative for rash.  Neurological: Negative for headaches.  Hematological: Negative for adenopathy.  Psychiatric/Behavioral:       Denies depression/anxiety       Past Medical History  Diagnosis Date  . Hypertension   . Thyroid disease   . Chicken pox   . Wears contact lenses   . Anxiety     scared of needles  . GERD (gastroesophageal reflux disease)   . PONV (postoperative nausea and vomiting)     History   Social History  . Marital Status: Married    Spouse Name: N/A  . Number of Children: N/A  . Years of Education: N/A   Occupational History  . Not on file.   Social History Main Topics  . Smoking status: Never Smoker   . Smokeless tobacco: Not on file  . Alcohol Use: Yes     Comment: social reports 4 beers a night  . Drug Use: No  . Sexual Activity: Not on file   Other Topics Concern  . 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 Wisdom tooth extraction    . Umbilical hernia repair N/A 06/13/2014    Procedure: HERNIA REPAIR UMBILICAL ADULT;  Surgeon: Donnie Mesa, MD;  Location: [place];  Service: General;  Laterality: N/A;  . Insertion of mesh N/A 06/13/2014    Procedure: INSERTION OF MESH;  Surgeon: Donnie Mesa, MD;  Location: [place];  Service: General;  Laterality: N/A;    Family History  Problem Relation Age of Onset  . Arthritis Mother   . Alcohol abuse Father   . Cancer Brother 40    Thyroid   . Heart disease Maternal Grandmother   . Stroke Maternal Grandmother   . Hypertension Maternal Grandmother   . Heart disease Maternal Grandfather   . Stroke Maternal Grandfather   . Hypertension Maternal Grandfather   . Hyperlipidemia Paternal Grandmother   . Hyperlipidemia Paternal Grandfather   . Multiple sclerosis Father     died in late 50's    No Known Allergies  Current Outpatient Prescriptions on File Prior to Visit  Medication Sig Dispense Refill  . amLODipine (NORVASC) 2.5 MG tablet Take 1 tablet (2.5 mg total) by mouth daily. 30 tablet 0  .  fluticasone (FLONASE) 50 MCG/ACT nasal spray Place 2 sprays into both nostrils daily. 16 g 6  . lansoprazole (PREVACID) 30 MG capsule Take 30 mg by mouth daily at 12 noon.    Marland Kitchen levothyroxine (SYNTHROID) 50 MCG tablet Take 1 tablet (50 mcg total) by mouth daily. 30 tablet 0  . lisinopril-hydrochlorothiazide (PRINZIDE,ZESTORETIC) 20-25 MG per tablet Take 1 tablet by mouth daily. 30 tablet 0   No current facility-administered medications on file prior to visit.    BP 146/90 mmHg  Pulse 86  Temp(Src) 98.5 F (36.9 C) (Oral)  Resp 16  Ht 5\' 6"  (1.676 m)  Wt 187 lb 6.4 oz (85.004 kg)  BMI 30.26 kg/m2  SpO2 98%    Objective:   Physical Exam  Physical Exam  Constitutional: Muscular white male, He is oriented to person,  place, and time. He appears well-developed and well-nourished. No distress.  HENT:  Head: Normocephalic and atraumatic.  Right Ear: Tympanic membrane and ear canal normal.  Left Ear: Tympanic membrane and ear canal normal.  Mouth/Throat: Oropharynx is clear and moist.  Eyes: Pupils are equal, round, and reactive to light. No scleral icterus.  Neck: Normal range of motion. No thyromegaly present.  Cardiovascular: Normal rate and regular rhythm.   No murmur heard. Pulmonary/Chest: Effort normal and breath sounds normal. No respiratory distress. He has no wheezes. He has no rales. He exhibits no tenderness.  Abdominal: Soft. Bowel sounds are normal. He exhibits no distension and no mass. There is no tenderness. There is no rebound and no guarding.  Musculoskeletal: He exhibits no edema.  Lymphadenopathy:    He has no cervical adenopathy.  Neurological: He is alert and oriented to person, place, and time. He has normal patellar reflexes. He exhibits normal muscle tone. Coordination normal.  Skin: Skin is warm and dry.  Psychiatric: He has a normal mood and affect. His behavior is normal. Judgment and thought content normal.          Assessment & Plan:         Assessment & Plan:

## 2014-10-15 NOTE — Progress Notes (Signed)
Pre visit review using our clinic review tool, if applicable. No additional management support is needed unless otherwise documented below in the visit note. 

## 2014-10-15 NOTE — Assessment & Plan Note (Signed)
Discussed adding cardio to his exercise routine.  Obtain fasting labs. Tdap today.

## 2014-10-17 NOTE — Assessment & Plan Note (Signed)
Uncontrolled, increase amlodipine from 2.5 mg to 5mg  once daily.

## 2014-10-18 ENCOUNTER — Telehealth: Payer: Self-pay | Admitting: Family

## 2014-10-18 DIAGNOSIS — I444 Left anterior fascicular block: Secondary | ICD-10-CM

## 2014-10-18 NOTE — Telephone Encounter (Signed)
See my chart message

## 2014-10-19 ENCOUNTER — Encounter: Payer: Self-pay | Admitting: Family

## 2014-10-20 ENCOUNTER — Other Ambulatory Visit: Payer: Self-pay | Admitting: Family

## 2014-10-22 ENCOUNTER — Other Ambulatory Visit: Payer: BLUE CROSS/BLUE SHIELD

## 2014-10-22 LAB — TSH: TSH: 2.47 u[IU]/mL (ref 0.35–4.50)

## 2014-10-22 LAB — CBC WITH DIFFERENTIAL/PLATELET
BASOS ABS: 0 10*3/uL (ref 0.0–0.1)
Basophils Relative: 0.7 % (ref 0.0–3.0)
Eosinophils Absolute: 0.1 10*3/uL (ref 0.0–0.7)
Eosinophils Relative: 3.1 % (ref 0.0–5.0)
HCT: 50.9 % (ref 39.0–52.0)
Hemoglobin: 17.7 g/dL — ABNORMAL HIGH (ref 13.0–17.0)
LYMPHS PCT: 22.8 % (ref 12.0–46.0)
Lymphs Abs: 1.1 10*3/uL (ref 0.7–4.0)
MCHC: 34.7 g/dL (ref 30.0–36.0)
MCV: 89.4 fl (ref 78.0–100.0)
MONO ABS: 0.5 10*3/uL (ref 0.1–1.0)
Monocytes Relative: 11.1 % (ref 3.0–12.0)
NEUTROS PCT: 62.3 % (ref 43.0–77.0)
Neutro Abs: 2.9 10*3/uL (ref 1.4–7.7)
Platelets: 230 10*3/uL (ref 150.0–400.0)
RBC: 5.7 Mil/uL (ref 4.22–5.81)
RDW: 13.5 % (ref 11.5–15.5)
WBC: 4.7 10*3/uL (ref 4.0–10.5)

## 2014-10-22 LAB — URINALYSIS, ROUTINE W REFLEX MICROSCOPIC
Hgb urine dipstick: NEGATIVE
LEUKOCYTES UA: NEGATIVE
Nitrite: NEGATIVE
RBC / HPF: NONE SEEN (ref 0–?)
SPECIFIC GRAVITY, URINE: 1.015 (ref 1.000–1.030)
Total Protein, Urine: NEGATIVE
UROBILINOGEN UA: 0.2 (ref 0.0–1.0)
Urine Glucose: NEGATIVE
WBC, UA: NONE SEEN (ref 0–?)
pH: 6.5 (ref 5.0–8.0)

## 2014-10-22 LAB — BASIC METABOLIC PANEL
BUN: 17 mg/dL (ref 6–23)
CALCIUM: 9.4 mg/dL (ref 8.4–10.5)
CO2: 30 mEq/L (ref 19–32)
CREATININE: 1.36 mg/dL (ref 0.40–1.50)
Chloride: 101 mEq/L (ref 96–112)
GFR: 59.23 mL/min — AB (ref >60.00)
Glucose, Bld: 101 mg/dL — ABNORMAL HIGH (ref 70–99)
Potassium: 4 mEq/L (ref 3.5–5.1)
Sodium: 136 mEq/L (ref 135–145)

## 2014-10-22 LAB — HEPATIC FUNCTION PANEL
ALBUMIN: 4 g/dL (ref 3.5–5.2)
ALK PHOS: 42 U/L (ref 39–117)
ALT: 86 U/L — ABNORMAL HIGH (ref 0–53)
AST: 66 U/L — ABNORMAL HIGH (ref 0–37)
BILIRUBIN TOTAL: 0.8 mg/dL (ref 0.2–1.2)
Bilirubin, Direct: 0.2 mg/dL (ref 0.0–0.3)
Total Protein: 7 g/dL (ref 6.0–8.3)

## 2014-10-22 LAB — LIPID PANEL
CHOL/HDL RATIO: 3
Cholesterol: 175 mg/dL (ref 0–200)
HDL: 52 mg/dL (ref >39.00)
LDL CALC: 110 mg/dL — AB (ref 0–99)
NONHDL: 123
Triglycerides: 65 mg/dL (ref 0.0–149.0)
VLDL: 13 mg/dL (ref 0.0–40.0)

## 2014-10-23 ENCOUNTER — Encounter: Payer: Self-pay | Admitting: Family

## 2014-10-23 ENCOUNTER — Telehealth: Payer: Self-pay | Admitting: Family

## 2014-10-23 DIAGNOSIS — R945 Abnormal results of liver function studies: Secondary | ICD-10-CM

## 2014-10-23 DIAGNOSIS — K760 Fatty (change of) liver, not elsewhere classified: Secondary | ICD-10-CM | POA: Insufficient documentation

## 2014-10-23 DIAGNOSIS — R7989 Other specified abnormal findings of blood chemistry: Secondary | ICD-10-CM

## 2014-10-23 NOTE — Telephone Encounter (Signed)
Liver function testing is elevated.  How much alcohol does he use and how often? I would recommend that he complete US of abdomen to evaluate for fatty liver and also complete lab work as below.  Cholesterol and thyroid look good.

## 2014-10-23 NOTE — Telephone Encounter (Signed)
Notified pt and he voices understanding. U/s abd scheduled for 10/31/14 at 10:30am. He will complete labs at 8:30am on 5/18. Future orders signed. Pt states that he drinks 2 mixed drinks and 4-5 cans beer every night.

## 2014-10-23 NOTE — Telephone Encounter (Signed)
Left message with pt's wife to have pt return my call.

## 2014-10-24 ENCOUNTER — Encounter: Payer: Self-pay | Admitting: Family

## 2014-10-24 NOTE — Telephone Encounter (Signed)
Alcohol could be contributing factor to his abnormal LFT's. Advise pt that he should cut back on alcohol (ideally eliminate) however, definitely keep to 2 or less drinks/day.

## 2014-10-24 NOTE — Telephone Encounter (Signed)
Notified pt's wife  

## 2014-10-31 ENCOUNTER — Other Ambulatory Visit (INDEPENDENT_AMBULATORY_CARE_PROVIDER_SITE_OTHER): Payer: BLUE CROSS/BLUE SHIELD

## 2014-10-31 ENCOUNTER — Ambulatory Visit (HOSPITAL_BASED_OUTPATIENT_CLINIC_OR_DEPARTMENT_OTHER)
Admission: RE | Admit: 2014-10-31 | Discharge: 2014-10-31 | Disposition: A | Discharge status: Home / Self Care | Payer: BLUE CROSS/BLUE SHIELD | Source: Ambulatory Visit | Attending: Family | Admitting: Family

## 2014-10-31 ENCOUNTER — Telehealth: Payer: Self-pay | Admitting: Family

## 2014-10-31 DIAGNOSIS — R9431 Abnormal electrocardiogram [ECG] [EKG]: Secondary | ICD-10-CM | POA: Insufficient documentation

## 2014-10-31 DIAGNOSIS — I444 Left anterior fascicular block: Secondary | ICD-10-CM

## 2014-10-31 DIAGNOSIS — R945 Abnormal results of liver function studies: Secondary | ICD-10-CM

## 2014-10-31 DIAGNOSIS — R7989 Other specified abnormal findings of blood chemistry: Secondary | ICD-10-CM | POA: Diagnosis not present

## 2014-10-31 LAB — HEPATITIS PANEL, ACUTE
HCV AB: NEGATIVE
HEP B S AG: NEGATIVE
Hep A IgM: NONREACTIVE
Hep B C IgM: NONREACTIVE

## 2014-10-31 LAB — FERRITIN: FERRITIN: 194.5 ng/mL (ref 22.0–322.0)

## 2014-10-31 NOTE — Progress Notes (Signed)
  Echocardiogram 2D Echocardiogram has been performed.  Austin Boyd 10/31/2014, 9:39 AM

## 2014-10-31 NOTE — Telephone Encounter (Signed)
Dr. Sallyanne Kuster,  Thanks for reading Austin Boyd's echo.  Do you have a summary/impression available on this report please? thanks

## 2014-10-31 NOTE — Telephone Encounter (Signed)
That was a novel Emergency planning/management officer. Think I fixed it. Please let me know if report still missing summary

## 2014-11-01 ENCOUNTER — Encounter: Payer: Self-pay | Admitting: Family

## 2015-02-04 MED ORDER — HYDROMORPHONE HCL 1 MG/ML IJ SOLN
INTRAMUSCULAR | Status: DC
Start: 2015-02-04 — End: 2015-02-04
  Filled 2015-02-04: qty 1

## 2015-02-04 MED ORDER — METOCLOPRAMIDE HCL 5 MG/ML IJ SOLN
INTRAMUSCULAR | Status: AC
  Filled 2015-02-04: qty 2

## 2015-02-04 MED ORDER — DIPHENHYDRAMINE HCL 50 MG/ML IJ SOLN
INTRAMUSCULAR | Status: AC
  Filled 2015-02-04: qty 1

## 2015-02-21 ENCOUNTER — Encounter: Payer: Self-pay | Admitting: Family

## 2015-05-26 ENCOUNTER — Other Ambulatory Visit: Payer: Self-pay | Admitting: Family

## 2015-05-27 MED ORDER — LISINOPRIL-HYDROCHLOROTHIAZIDE 20-25 MG PO TABS: 1.0000 | ORAL_TABLET | Freq: Every day | ORAL | Status: DC

## 2015-05-27 MED ORDER — LEVOTHYROXINE SODIUM 50 MCG PO TABS: 50.0000 ug | ORAL_TABLET | Freq: Every day | ORAL | Status: DC

## 2015-05-27 NOTE — Telephone Encounter (Signed)
Lisinopril and levothyroxine refills denied to [facility]. Pt last seen 10/15/14 and advised 1 month follow up of BP. Pt is past due. I sent 30 day supply of each to local [facility]. Please call pt to arrange appt. With Melissa soon. Thanks!

## 2015-05-28 NOTE — Telephone Encounter (Signed)
LM for pt to call and schedule f/u within 30 days. Also notified that 30 day supply was sent to local [facility] not [facility].

## 2015-06-07 ENCOUNTER — Ambulatory Visit: Payer: BLUE CROSS/BLUE SHIELD | Admitting: Family

## 2015-06-24 ENCOUNTER — Ambulatory Visit: Payer: BLUE CROSS/BLUE SHIELD | Admitting: Family

## 2015-07-01 ENCOUNTER — Ambulatory Visit (INDEPENDENT_AMBULATORY_CARE_PROVIDER_SITE_OTHER): Payer: BLUE CROSS/BLUE SHIELD | Admitting: Family

## 2015-07-01 ENCOUNTER — Encounter: Payer: Self-pay | Admitting: Family

## 2015-07-01 VITALS — BP 143/82 | HR 87 | Temp 99.0°F | Resp 18 | Ht 66.0 in | Wt 185.8 lb

## 2015-07-01 DIAGNOSIS — I10 Essential (primary) hypertension: Secondary | ICD-10-CM

## 2015-07-01 DIAGNOSIS — H938X2 Other specified disorders of left ear: Secondary | ICD-10-CM | POA: Diagnosis not present

## 2015-07-01 DIAGNOSIS — K760 Fatty (change of) liver, not elsewhere classified: Secondary | ICD-10-CM | POA: Diagnosis not present

## 2015-07-01 DIAGNOSIS — E039 Hypothyroidism, unspecified: Secondary | ICD-10-CM | POA: Diagnosis not present

## 2015-07-01 MED ORDER — CEPHALEXIN 500 MG PO CAPS: 500.0000 mg | ORAL_CAPSULE | Freq: Three times a day (TID) | ORAL | Status: DC

## 2015-07-01 MED ORDER — LEVOTHYROXINE SODIUM 50 MCG PO TABS: 50.0000 ug | ORAL_TABLET | Freq: Every day | ORAL | Status: DC

## 2015-07-01 MED ORDER — AMLODIPINE BESYLATE 10 MG PO TABS
10.0000 mg | ORAL_TABLET | Freq: Every day | ORAL | Status: DC
Start: 2015-07-01 — End: 2015-08-05

## 2015-07-01 MED ORDER — LISINOPRIL-HYDROCHLOROTHIAZIDE 20-25 MG PO TABS: 1.0000 | ORAL_TABLET | Freq: Every day | ORAL | Status: DC

## 2015-07-01 MED FILL — LISINOPRIL-HCTZ 20-25 MG TA: 20-25 | 30 days supply | Qty: 30 | Fill #0

## 2015-07-01 MED FILL — LEVOTHYROXINE 50 MCG TABLET: 50 | 30 days supply | Qty: 30 | Fill #0

## 2015-07-01 MED FILL — CEPHALEXIN 500 MG CAPSULE: 500 | 7 days supply | Qty: 21 | Fill #0

## 2015-07-01 MED FILL — AMLODIPINE BESYLATE 10 MG T: 10 | 30 days supply | Qty: 30 | Fill #0

## 2015-07-01 NOTE — Assessment & Plan Note (Signed)
BP above goal. Continue lisinopril/hctz, increase amlodipine from 5mg  to 10mg . Obtain bmet.

## 2015-07-01 NOTE — Progress Notes (Signed)
Subjective:    Patient ID: Austin Boyd, male    DOB: [birth year], 50 y.o.   MRN: [id number]  HPI   Austin Boyd is a 50 yr old male who presents today for follow up.  HTN- Currently maintained on lisinopril hctz.   BP Readings from Last 3 Encounters:  07/01/15 143/82  10/15/14 146/90  06/13/14 149/91   Hypothyroid- maintained on synthroid 50mg .  Lab Results  Component Value Date   TSH 2.47 10/22/2014   Skin Problem- Patient reports bump on left ear x 2 weeks.  Reports that the area became irritated by welding helmet.   Review of Systems See HPI  Past Medical History  Diagnosis Date  . Hypertension   . Thyroid disease   . Chicken pox   . Wears contact lenses   . Anxiety     scared of needles  . GERD (gastroesophageal reflux disease)   . PONV (postoperative nausea and vomiting)     Social History   Social History  . Marital Status: Married    Spouse Name: N/A  . Number of Children: N/A  . Years of Education: N/A   Occupational History  . Not on file.   Social History Main Topics  . Smoking status: Never Smoker   . Smokeless tobacco: Not on file  . Alcohol Use: Yes     Comment: social reports 4 beers a night  . Drug Use: No  . Sexual Activity: Not on file   Other Topics Concern  . 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 Wisdom tooth extraction    . Umbilical hernia repair N/A 06/13/2014    Procedure: HERNIA REPAIR UMBILICAL ADULT;  Surgeon: Donnie Mesa, MD;  Location: [place];  Service: General;  Laterality: N/A;  . Insertion of mesh N/A 06/13/2014    Procedure: INSERTION OF MESH;  Surgeon: Donnie Mesa, MD;  Location: [place];  Service: General;  Laterality: N/A;    Family History   Problem Relation Age of Onset  . Arthritis Mother   . Alcohol abuse Father   . Cancer Brother 40    Thyroid   . Heart disease Maternal Grandmother   . Stroke Maternal Grandmother   . Hypertension Maternal Grandmother   . Heart disease Maternal Grandfather   . Stroke Maternal Grandfather   . Hypertension Maternal Grandfather   . Hyperlipidemia Paternal Grandmother   . Hyperlipidemia Paternal Grandfather   . Multiple sclerosis Father     died in late 66's    No Known Allergies  Current Outpatient Prescriptions on File Prior to Visit  Medication Sig Dispense Refill  . lansoprazole (PREVACID) 30 MG capsule Take 30 mg by mouth daily at 12 noon.     No current facility-administered medications on file prior to visit.    BP 143/82 mmHg  Pulse 87  Temp(Src) 99 F (37.2 C) (Oral)  Resp 18  Ht 5\' 6"  (1.676 m)  Wt 185 lb 12.8 oz (84.278 kg)  BMI 30.00 kg/m2  SpO2 97%       Objective:   Physical Exam  Constitutional: He is oriented to person, place, and time. He appears well-developed and well-nourished.  No distress.  HENT:  Head: Normocephalic and atraumatic.    + firm induration left upper inner pinna, no obvious fluctuance    Cardiovascular: Normal rate and regular rhythm.   No murmur heard. Pulmonary/Chest: Effort normal and breath sounds normal. No respiratory distress. He has no wheezes. He has no rales.  Musculoskeletal: He exhibits no edema.  Neurological: He is alert and oriented to person, place, and time.  Skin: Skin is warm and dry.  Psychiatric: He has a normal mood and affect. His behavior is normal. Thought content normal.          Assessment & Plan:  Ear swelling left ear-  Trial of Keflex for possible abscess. Advised pt as follows:   Call if ear becomes more swollen, red or if it begins to drain. Call if swelling is not improved in 1 week.

## 2015-07-01 NOTE — Patient Instructions (Signed)
Start Keflex (antibiotic) for your ear. Call if ear becomes more swollen, red or if it begins to drain. Call if swelling is not improved in 1 week. Complete lab work prior to leaving. Increase amlodipine from 5 mg to 10mg .

## 2015-07-01 NOTE — Progress Notes (Signed)
Pre visit review using our clinic review tool, if applicable. No additional management support is needed unless otherwise documented below in the visit note. 

## 2015-07-01 NOTE — Assessment & Plan Note (Signed)
Clinically stable, obtain tsh, continue synthroid.  

## 2015-07-01 NOTE — Assessment & Plan Note (Signed)
Discussed low fat/low cholesterol diet.  

## 2015-07-02 ENCOUNTER — Encounter: Payer: Self-pay | Admitting: Family

## 2015-07-02 LAB — BASIC METABOLIC PANEL
BUN: 22 mg/dL (ref 6–23)
CO2: 32 mEq/L (ref 19–32)
Calcium: 9.8 mg/dL (ref 8.4–10.5)
Chloride: 99 mEq/L (ref 96–112)
Creatinine, Ser: 1.45 mg/dL (ref 0.40–1.50)
GFR: 54.85 mL/min — ABNORMAL LOW (ref >60.00)
Glucose, Bld: 98 mg/dL (ref 70–99)
Potassium: 3.9 mEq/L (ref 3.5–5.1)
Sodium: 138 mEq/L (ref 135–145)

## 2015-07-02 LAB — TSH: TSH: 3.48 u[IU]/mL (ref 0.35–4.50)

## 2015-07-09 ENCOUNTER — Telehealth: Payer: Self-pay | Admitting: Family

## 2015-07-09 ENCOUNTER — Emergency Department (HOSPITAL_BASED_OUTPATIENT_CLINIC_OR_DEPARTMENT_OTHER)
Admission: EM | Admit: 2015-07-09 | Discharge: 2015-07-09 | Disposition: A | Discharge status: Home / Self Care | Payer: BLUE CROSS/BLUE SHIELD | Attending: Emergency Medicine | Admitting: Emergency Medicine

## 2015-07-09 ENCOUNTER — Emergency Department (HOSPITAL_BASED_OUTPATIENT_CLINIC_OR_DEPARTMENT_OTHER): Payer: BLUE CROSS/BLUE SHIELD

## 2015-07-09 ENCOUNTER — Encounter (HOSPITAL_BASED_OUTPATIENT_CLINIC_OR_DEPARTMENT_OTHER): Payer: Self-pay

## 2015-07-09 DIAGNOSIS — Z973 Presence of spectacles and contact lenses: Secondary | ICD-10-CM | POA: Insufficient documentation

## 2015-07-09 DIAGNOSIS — Z8619 Personal history of other infectious and parasitic diseases: Secondary | ICD-10-CM | POA: Diagnosis not present

## 2015-07-09 DIAGNOSIS — Z792 Long term (current) use of antibiotics: Secondary | ICD-10-CM | POA: Diagnosis not present

## 2015-07-09 DIAGNOSIS — E079 Disorder of thyroid, unspecified: Secondary | ICD-10-CM | POA: Diagnosis not present

## 2015-07-09 DIAGNOSIS — M25572 Pain in left ankle and joints of left foot: Secondary | ICD-10-CM | POA: Diagnosis present

## 2015-07-09 DIAGNOSIS — R7989 Other specified abnormal findings of blood chemistry: Secondary | ICD-10-CM | POA: Diagnosis not present

## 2015-07-09 DIAGNOSIS — K219 Gastro-esophageal reflux disease without esophagitis: Secondary | ICD-10-CM | POA: Insufficient documentation

## 2015-07-09 DIAGNOSIS — Z8659 Personal history of other mental and behavioral disorders: Secondary | ICD-10-CM | POA: Insufficient documentation

## 2015-07-09 DIAGNOSIS — Z79899 Other long term (current) drug therapy: Secondary | ICD-10-CM | POA: Insufficient documentation

## 2015-07-09 DIAGNOSIS — R600 Localized edema: Secondary | ICD-10-CM | POA: Diagnosis not present

## 2015-07-09 LAB — CBC WITH DIFFERENTIAL/PLATELET
Basophils Absolute: 0 10*3/uL (ref 0.0–0.1)
Basophils Relative: 0 %
Eosinophils Absolute: 0.1 10*3/uL (ref 0.0–0.7)
Eosinophils Relative: 1 %
HCT: 48.5 % (ref 39.0–52.0)
Hemoglobin: 16.7 g/dL (ref 13.0–17.0)
Lymphocytes Relative: 20 %
Lymphs Abs: 1.3 10*3/uL (ref 0.7–4.0)
MCH: 30.6 pg (ref 26.0–34.0)
MCHC: 34.4 g/dL (ref 30.0–36.0)
MCV: 88.8 fL (ref 78.0–100.0)
Monocytes Absolute: 0.7 10*3/uL (ref 0.1–1.0)
Monocytes Relative: 10 %
Neutro Abs: 4.5 10*3/uL (ref 1.7–7.7)
Neutrophils Relative %: 69 %
Platelets: 195 10*3/uL (ref 150–400)
RBC: 5.46 MIL/uL (ref 4.22–5.81)
RDW: 12.6 % (ref 11.5–15.5)
WBC: 6.6 10*3/uL (ref 4.0–10.5)

## 2015-07-09 LAB — URIC ACID: URIC ACID, SERUM: 5.9 mg/dL (ref 4.4–7.6)

## 2015-07-09 LAB — BASIC METABOLIC PANEL
Anion gap: 10 (ref 5–15)
BUN: 28 mg/dL — AB (ref 6–20)
CALCIUM: 9.4 mg/dL (ref 8.9–10.3)
CHLORIDE: 98 mmol/L — AB (ref 101–111)
CO2: 26 mmol/L (ref 22–32)
CREATININE: 1.54 mg/dL — AB (ref 0.61–1.24)
GFR calc non Af Amer: 51 mL/min — ABNORMAL LOW (ref >60)
GFR, EST AFRICAN AMERICAN: 60 mL/min — AB (ref >60)
Glucose, Bld: 106 mg/dL — ABNORMAL HIGH (ref 65–99)
Potassium: 3.9 mmol/L (ref 3.5–5.1)
SODIUM: 134 mmol/L — AB (ref 135–145)

## 2015-07-09 LAB — URINALYSIS, ROUTINE W REFLEX MICROSCOPIC
BILIRUBIN URINE: NEGATIVE
Glucose, UA: NEGATIVE mg/dL
HGB URINE DIPSTICK: NEGATIVE
KETONES UR: NEGATIVE mg/dL
Leukocytes, UA: NEGATIVE
NITRITE: NEGATIVE
Protein, ur: NEGATIVE mg/dL
Specific Gravity, Urine: 1.015 (ref 1.005–1.030)
pH: 6.5 (ref 5.0–8.0)

## 2015-07-09 NOTE — ED Notes (Signed)
MD James at bedside.  

## 2015-07-09 NOTE — Discharge Instructions (Signed)
Continue your medications at the current dosage. Increase your fluid intake. Your kidney function has slightly deteriorated. Follow up with your primary care physician within one week to recheck this as your medications were recently changed.   Edema Edema is an abnormal buildup of fluids in your bodytissues. Edema is somewhatdependent on gravity to pull the fluid to the lowest place in your body. That makes the condition more common in the legs and thighs (lower extremities). Painless swelling of the feet and ankles is common and becomes more likely as you get older. It is also common in looser tissues, like around your eyes.  When the affected area is squeezed, the fluid may move out of that spot and leave a dent for a few moments. This dent is called pitting.  CAUSES  There are many possible causes of edema. Eating too much salt and being on your feet or sitting for a long time can cause edema in your legs and ankles. Hot weather may make edema worse. Common medical causes of edema include:  Heart failure.  Liver disease.  Kidney disease.  Weak blood vessels in your legs.  Cancer.  An injury.  Pregnancy.  Some medications.  Obesity. SYMPTOMS  Edema is usually painless.Your skin may look swollen or shiny.  DIAGNOSIS  Your health care provider may be able to diagnose edema by asking about your medical history and doing a physical exam. You may need to have tests such as X-rays, an electrocardiogram, or blood tests to check for medical conditions that may cause edema.  TREATMENT  Edema treatment depends on the cause. If you have heart, liver, or kidney disease, you need the treatment appropriate for these conditions. General treatment may include:  Elevation of the affected body part above the level of your heart.  Compression of the affected body part. Pressure from elastic bandages or support stockings squeezes the tissues and forces fluid back into the blood vessels. This  keeps fluid from entering the tissues.  Restriction of fluid and salt intake.  Use of a water pill (diuretic). These medications are appropriate only for some types of edema. They pull fluid out of your body and make you urinate more often. This gets rid of fluid and reduces swelling, but diuretics can have side effects. Only use diuretics as directed by your health care provider. HOME CARE INSTRUCTIONS   Keep the affected body part above the level of your heart when you are lying down.   Do not sit still or stand for prolonged periods.   Do not put anything directly under your knees when lying down.  Do not wear constricting clothing or garters on your upper legs.   Exercise your legs to work the fluid back into your blood vessels. This may help the swelling go down.   Wear elastic bandages or support stockings to reduce ankle swelling as directed by your health care provider.   Eat a low-salt diet to reduce fluid if your health care provider recommends it.   Only take medicines as directed by your health care provider. SEEK MEDICAL CARE IF:   Your edema is not responding to treatment.  You have heart, liver, or kidney disease and notice symptoms of edema.  You have edema in your legs that does not improve after elevating them.   You have sudden and unexplained weight gain. SEEK IMMEDIATE MEDICAL CARE IF:   You develop shortness of breath or chest pain.   You cannot breathe when you lie down.  You develop pain, redness, or warmth in the swollen areas.   You have heart, liver, or kidney disease and suddenly get edema.  You have a fever and your symptoms suddenly get worse. MAKE SURE YOU:   Understand these instructions.  Will watch your condition.  Will get help right away if you are not doing well or get worse.   This information is not intended to replace advice given to you by your health care provider. Make sure you discuss any questions you have with  your health care provider.   Document Released: 06/01/2005 Document Revised: 06/22/2014 Document Reviewed: 03/24/2013 Elsevier Interactive Patient Education [employer].

## 2015-07-09 NOTE — ED Notes (Signed)
Pt ambulated to US at this time.

## 2015-07-09 NOTE — ED Provider Notes (Signed)
CSN: [id number]     Arrival date & time 07/09/15  1621 History   First MD Initiated Contact with Patient 07/09/15 1635     Chief Complaint  Patient presents with  . Ankle Pain      HPI  She presents for evaluation of ankle pain. He states that one point that his ankle is painful at another point he states that he felt like his leg was tightly went to scratch his leg. He was concerned that he was placed on a new medication by his primary care physician in the day. Actually this was an increase in his dose of his lisinopril/HCTZ dosage. He has an infection on his ear and is on antibiotic for this. He is walking without limp. Has not had any prolonged immobilization. No history of blood clot.  Past Medical History  Diagnosis Date  . Hypertension   . Thyroid disease   . Chicken pox   . Wears contact lenses   . Anxiety     scared of needles  . GERD (gastroesophageal reflux disease)   . PONV (postoperative nausea and vomiting)    Past Surgical History  Procedure Laterality Date  . Wisdom tooth extraction    . Umbilical hernia repair N/A 06/13/2014    Procedure: HERNIA REPAIR UMBILICAL ADULT;  Surgeon: Donnie Mesa, MD;  Location: [place];  Service: General;  Laterality: N/A;  . Insertion of mesh N/A 06/13/2014    Procedure: INSERTION OF MESH;  Surgeon: Donnie Mesa, MD;  Location: [place];  Service: General;  Laterality: N/A;   Family History  Problem Relation Age of Onset  . Arthritis Mother   . Alcohol abuse Father   . Cancer Brother 40    Thyroid   . Heart disease Maternal Grandmother   . Stroke Maternal Grandmother   . Hypertension Maternal Grandmother   . Heart disease Maternal Grandfather   . Stroke Maternal Grandfather   . Hypertension Maternal Grandfather   . Hyperlipidemia Paternal Grandmother   . Hyperlipidemia Paternal Grandfather   . Multiple sclerosis Father     died in late 57's   Social History  Substance Use Topics  .  Smoking status: Never Smoker   . Smokeless tobacco: None  . Alcohol Use: No     Comment: social reports 4 beers a night    Review of Systems  Constitutional: Negative for fever, chills, diaphoresis, appetite change and fatigue.  HENT: Negative for mouth sores, sore throat and trouble swallowing.   Eyes: Negative for visual disturbance.  Respiratory: Negative for cough, chest tightness, shortness of breath and wheezing.   Cardiovascular: Negative for chest pain.  Gastrointestinal: Negative for nausea, vomiting, abdominal pain, diarrhea and abdominal distention.  Endocrine: Negative for polydipsia, polyphagia and polyuria.  Genitourinary: Negative for dysuria, frequency and hematuria.  Musculoskeletal: Negative for gait problem.  Skin: Negative for color change, pallor and rash.       Left lower leg pain. Points near his ankle and states that he feels like it "looks and feels swollen".  Neurological: Negative for dizziness, syncope, light-headedness and headaches.  Hematological: Does not bruise/bleed easily.  Psychiatric/Behavioral: Negative for behavioral problems and confusion.      Allergies  Review of patient's allergies indicates no known allergies.  Home Medications   Prior to Admission medications   Medication Sig Start Date End Date Taking? Authorizing Provider  amLODipine (NORVASC) 10 MG tablet Take 1 tablet (10 mg total) by mouth daily. 07/01/15  Debbrah Alar, NP  cephALEXin (KEFLEX) 500 MG capsule Take 1 capsule (500 mg total) by mouth 3 (three) times daily. 07/01/15   Debbrah Alar, NP  lansoprazole (PREVACID) 30 MG capsule Take 30 mg by mouth daily at 12 noon.    Historical Provider, MD  levothyroxine (SYNTHROID) 50 MCG tablet Take 1 tablet (50 mcg total) by mouth daily. 07/01/15   Debbrah Alar, NP  lisinopril-hydrochlorothiazide (PRINZIDE,ZESTORETIC) 20-25 MG tablet Take 1 tablet by mouth daily. 07/01/15   Debbrah Alar, NP   BP 158/96 mmHg   Pulse 87  Temp(Src) 98.3 F (36.8 C) (Oral)  Resp 18  Ht 5\' 7"  (1.702 m)  Wt 185 lb (83.915 kg)  BMI 28.97 kg/m2  SpO2 97% Physical Exam  Constitutional: He is oriented to person, place, and time. He appears well-developed and well-nourished. No distress.  HENT:  Head: Normocephalic.  Eyes: Conjunctivae are normal. Pupils are equal, round, and reactive to light. No scleral icterus.  Neck: Normal range of motion. Neck supple. No thyromegaly present.  Cardiovascular: Normal rate and regular rhythm.  Exam reveals no gallop and no friction rub.   No murmur heard. Pulmonary/Chest: Effort normal and breath sounds normal. No respiratory distress. He has no wheezes. He has no rales.  Abdominal: Soft. Bowel sounds are normal. He exhibits no distension. There is no tenderness. There is no rebound.  Musculoskeletal: Normal range of motion.  Neurological: He is alert and oriented to person, place, and time.  Skin: Skin is warm and dry. No rash noted.     Psychiatric: He has a normal mood and affect. His behavior is normal.    ED Course  Procedures (including critical care time) Labs Review Labs Reviewed  BASIC METABOLIC PANEL - Abnormal; Notable for the following:    Sodium 134 (*)    Chloride 98 (*)    Glucose, Bld 106 (*)    BUN 28 (*)    Creatinine, Ser 1.54 (*)    GFR calc non Af Amer 51 (*)    GFR calc Af Amer 60 (*)    All other components within normal limits  CBC WITH DIFFERENTIAL/PLATELET  URINALYSIS, ROUTINE W REFLEX MICROSCOPIC (NOT AT [facility])  URIC ACID    Imaging Review US Venous Img Lower Bilateral  07/09/2015  CLINICAL DATA:  Bilateral lower extremity edema for 2 days. EXAM: BILATERAL LOWER EXTREMITY VENOUS DOPPLER ULTRASOUND TECHNIQUE: Gray-scale sonography with graded compression, as well as color Doppler and duplex ultrasound were performed to evaluate the lower extremity deep venous systems from the level of the common femoral vein and including the common femoral,  femoral, profunda femoral, popliteal and calf veins including the posterior tibial, peroneal and gastrocnemius veins when visible. The superficial great saphenous vein was also interrogated. Spectral Doppler was utilized to evaluate flow at rest and with distal augmentation maneuvers in the common femoral, femoral and popliteal veins. COMPARISON:  None. FINDINGS: RIGHT LOWER EXTREMITY Common Femoral Vein: No evidence of thrombus. Normal compressibility, respiratory phasicity and response to augmentation. Saphenofemoral Junction: No evidence of thrombus. Normal compressibility and flow on color Doppler imaging. Profunda Femoral Vein: No evidence of thrombus. Normal compressibility and flow on color Doppler imaging. Femoral Vein: Duplicated femoral vein is demonstrated consistent with normal variation. No evidence of thrombus. Normal compressibility, respiratory phasicity and response to augmentation. Popliteal Vein: No evidence of thrombus. Normal compressibility, respiratory phasicity and response to augmentation. Calf Veins: No evidence of thrombus. Normal compressibility and flow on color Doppler imaging. Superficial Great Saphenous Vein:  No evidence of thrombus. Normal compressibility and flow on color Doppler imaging. Venous Reflux:  None. Other Findings:  None. LEFT LOWER EXTREMITY Common Femoral Vein: No evidence of thrombus. Normal compressibility, respiratory phasicity and response to augmentation. Saphenofemoral Junction: No evidence of thrombus. Normal compressibility and flow on color Doppler imaging. Profunda Femoral Vein: No evidence of thrombus. Normal compressibility and flow on color Doppler imaging. Femoral Vein: Give it femoral vein is demonstrated consistent with normal variation. No evidence of thrombus. Normal compressibility, respiratory phasicity and response to augmentation. Popliteal Vein: No evidence of thrombus. Normal compressibility, respiratory phasicity and response to augmentation.  Calf Veins: No evidence of thrombus. Normal compressibility and flow on color Doppler imaging. Superficial Great Saphenous Vein: No evidence of thrombus. Normal compressibility and flow on color Doppler imaging. Venous Reflux:  None. Other Findings:  None. IMPRESSION: No evidence of deep venous thrombosis. Electronically Signed   By: Lucienne Capers M.D.   On: 07/09/2015 18:23   I have personally reviewed and evaluated these images and lab results as part of my medical decision-making.   EKG Interpretation None      MDM   Final diagnoses:  Leg edema    No blood clot. Normal uric acid. Slight deterioration of his creatinine. We discussed increasing his fluid intake. Elevating his legs and upper possible. Encouraged him to follow up with his primary care physician for recheck creatinine and GFR as he just had increase in his ACE inhibitor.    Tanna Furry, MD 07/09/15 [facility]

## 2015-07-09 NOTE — ED Notes (Signed)
Pt reports since yesterday with L ankle pain and mild swelling, no injury.  States went to pcp and placed on antibiotic.

## 2015-07-09 NOTE — Telephone Encounter (Signed)
PLEASE NOTE: All timestamps contained within this report are represented as [place] Standard Time. CONFIDENTIALTY NOTICE: This fax transmission is intended only for the addressee. It contains information that is legally privileged, confidential or otherwise protected from use or disclosure. If you are not the intended recipient, you are strictly prohibited from reviewing, disclosing, copying using or disseminating any of this information or taking any action in reliance on or regarding this information. If you have received this fax in error, please notify us immediately by telephone so that we can arrange for its return to [place]. Phone: [phone], Toll-Free: [phone], Fax: [phone] Page: 1 of 1 Call Id: [id number] [facility] Patient Name: Austin Boyd DOB: June 26, 1965 Initial Comment Caller states husband has swelling around ankles, knees, into groin area; legs are feverish to touch; very sore to touch; no fever but has chills; began abx on 01/16, stopped it last night, listed side effect; -not with him now; nurse at work looked at it and said go see MD in 3 days if not better; Nurse Assessment Nurse: Marcelline Deist, RN, Kermit Balo Date/Time (Eastern Time): 07/09/2015 12:59:02 PM Confirm and document reason for call. If symptomatic, describe symptoms. You must click the next button to save text entered. ---Caller states husband has swelling around ankles, knees, into groin area. His legs are feverish, red blotchy to touch, very sore to touch. He has no fever but has chills. He began antibiotic - Cephalexin on 01/16 for spot on his ear. He stopped it last night, listed side effect. Caller is not with him now. A nurse at work looked at it and said go see MD in 3 days if not better. He took Benadryl last night. Recently increased BP rx caller thinks. Has the patient traveled out of the country within the  last 30 days? ---Not Applicable Does the patient have any new or worsening symptoms? ---Yes Will a triage be completed? ---Yes Related visit to physician within the last 2 weeks? ---Yes Does the PT have any chronic conditions? (i.e. diabetes, asthma, etc.) ---Yes List chronic conditions. ---BP rx, thyroid, diuretic Is this a behavioral health or substance abuse call? ---No Guidelines Guideline Title Affirmed Question Affirmed Notes Leg Swelling and Edema [1] Thigh, calf, or ankle swelling AND [2] bilateral AND [3] 1 side is more swollen Final Disposition User See Physician within 4 Hours (or PCP triage) Marcelline Deist, RN, Lynda Referrals [facility] - ED Disagree/Comply: Comply

## 2015-07-09 NOTE — ED Notes (Signed)
MD at bedside. 

## 2015-07-10 NOTE — Telephone Encounter (Signed)
Pt went to the ER as advised.   

## 2015-08-05 ENCOUNTER — Ambulatory Visit (INDEPENDENT_AMBULATORY_CARE_PROVIDER_SITE_OTHER): Payer: BLUE CROSS/BLUE SHIELD | Admitting: Family

## 2015-08-05 ENCOUNTER — Encounter: Payer: Self-pay | Admitting: Family

## 2015-08-05 VITALS — BP 148/84 | HR 94 | Temp 98.9°F | Resp 18 | Ht 66.0 in | Wt 184.6 lb

## 2015-08-05 DIAGNOSIS — K219 Gastro-esophageal reflux disease without esophagitis: Secondary | ICD-10-CM

## 2015-08-05 DIAGNOSIS — E039 Hypothyroidism, unspecified: Secondary | ICD-10-CM

## 2015-08-05 DIAGNOSIS — I10 Essential (primary) hypertension: Secondary | ICD-10-CM

## 2015-08-05 MED ORDER — RANITIDINE HCL 150 MG PO TABS: 150.0000 mg | ORAL_TABLET | Freq: Two times a day (BID) | ORAL | Status: DC

## 2015-08-05 MED ORDER — AMLODIPINE BESYLATE 10 MG PO TABS: 5.0000 mg | ORAL_TABLET | Freq: Every day | ORAL | Status: DC

## 2015-08-05 MED ORDER — HYDROCHLOROTHIAZIDE 25 MG PO TABS: 25.0000 mg | ORAL_TABLET | Freq: Every day | ORAL | Status: DC

## 2015-08-05 MED ORDER — LISINOPRIL 40 MG PO TABS: 40.0000 mg | ORAL_TABLET | Freq: Every day | ORAL | Status: DC

## 2015-08-05 MED FILL — HYDROCHLOROTHIAZIDE 25 MG T: 25 | 30 days supply | Qty: 30 | Fill #0

## 2015-08-05 MED FILL — LISINOPRIL 40 MG TABLET: 40 | 30 days supply | Qty: 30 | Fill #0

## 2015-08-05 MED FILL — LEVOTHYROXINE 50 MCG TABLET: 50 | 30 days supply | Qty: 30 | Fill #1

## 2015-08-05 MED FILL — AMLODIPINE BESYLATE 10 MG T: 10 | 30 days supply | Qty: 15 | Fill #0

## 2015-08-05 NOTE — Patient Instructions (Addendum)
Continue amlodipine 5mg  once daily. Increase lisinopril from 20mg  to 40mg . Start HCTZ 25 mg tab.

## 2015-08-05 NOTE — Progress Notes (Signed)
Subjective:    Patient ID: Austin Boyd, male    DOB: 07/09/1965, 50 y.o.   MRN: [id number]  HPI  Austin Boyd is a 50 yr old male who presents today for follow up.  1) HTN- last visit amlodipine was increased from 5mg  to 10mg  but he had LE edema on this dose so he dropped it back down to 5mg .   BP Readings from Last 3 Encounters:  08/05/15 148/84  07/09/15 150/78  07/01/15 143/82   2) GERD-using otc prevacid prn.  Reports that he continues to have symptoms as well as dietary indiscretion.  3) Hypothyroid-  Feels well on current dose of synthroid.   Lab Results  Component Value Date   TSH 3.48 07/01/2015     Review of Systems See HPI  Past Medical History  Diagnosis Date  . Hypertension   . Thyroid disease   . Chicken pox   . Wears contact lenses   . Anxiety     scared of needles  . GERD (gastroesophageal reflux disease)   . PONV (postoperative nausea and vomiting)     Social History   Social History  . Marital Status: Married    Spouse Name: N/A  . Number of Children: N/A  . Years of Education: N/A   Occupational History  . Not on file.   Social History Main Topics  . Smoking status: Never Smoker   . Smokeless tobacco: Not on file  . Alcohol Use: No     Comment: social reports 4 beers a night  . Drug Use: No  . Sexual Activity: Not on file   Other Topics Concern  . 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 Wisdom tooth extraction    . Umbilical hernia repair N/A 06/13/2014    Procedure: HERNIA REPAIR UMBILICAL ADULT;  Surgeon: Donnie Mesa, MD;  Location: [place];  Service: General;  Laterality: N/A;  . Insertion of mesh N/A 06/13/2014    Procedure: INSERTION OF MESH;  Surgeon: Donnie Mesa, MD;   Location: [place];  Service: General;  Laterality: N/A;    Family History  Problem Relation Age of Onset  . Arthritis Mother   . Alcohol abuse Father   . Cancer Brother 40    Thyroid   . Heart disease Maternal Grandmother   . Stroke Maternal Grandmother   . Hypertension Maternal Grandmother   . Heart disease Maternal Grandfather   . Stroke Maternal Grandfather   . Hypertension Maternal Grandfather   . Hyperlipidemia Paternal Grandmother   . Hyperlipidemia Paternal Grandfather   . Multiple sclerosis Father     died in late 50's    No Known Allergies  Current Outpatient Prescriptions on File Prior to Visit  Medication Sig Dispense Refill  . levothyroxine (SYNTHROID) 50 MCG tablet Take 1 tablet (50 mcg total) by mouth daily. 30 tablet 5  . lisinopril-hydrochlorothiazide (PRINZIDE,ZESTORETIC) 20-25 MG tablet Take 1 tablet by mouth daily. 30 tablet 5   No current facility-administered medications on file prior to visit.    BP 148/84 mmHg  Pulse 94  Temp(Src) 98.9 F (37.2 C) (Oral)  Resp 18  Ht 5\' 6"  (1.676 m)  Wt 184 lb 9.6 oz (83.734 kg)  BMI 29.81 kg/m2  SpO2 98%       Objective:   Physical Exam  Constitutional: He is oriented to person, place, and time. He appears well-developed and well-nourished. No distress.  HENT:  Head: Normocephalic and atraumatic.  Cardiovascular: Normal rate and regular rhythm.   No murmur heard. Pulmonary/Chest: Effort normal and breath sounds normal. No respiratory distress. He has no wheezes. He has no rales.  Musculoskeletal: He exhibits no edema.  Neurological: He is alert and oriented to person, place, and time.  Skin: Skin is warm and dry.  Psychiatric: He has a normal mood and affect. His behavior is normal. Thought content normal.          Assessment & Plan:

## 2015-08-05 NOTE — Progress Notes (Signed)
Pre visit review using our clinic review tool, if applicable. No additional management support is needed unless otherwise documented below in the visit note. 

## 2015-08-05 NOTE — Assessment & Plan Note (Addendum)
Stable on synthroid, continue same.   ?

## 2015-08-05 NOTE — Assessment & Plan Note (Signed)
Trial of otc zantac 150mg  bid.

## 2015-08-05 NOTE — Assessment & Plan Note (Signed)
Uncontrolled. Could not tol 10mg  amlodipine due to edema.  Continue amlodipine 5mg . Increase lisinopril from 20mg  to 40mg .  Continue hctz.  Follow up in 2 weeks for nurse visit BP check and bmet.

## 2015-08-14 ENCOUNTER — Telehealth: Payer: Self-pay | Admitting: Family

## 2015-08-14 NOTE — Telephone Encounter (Signed)
08-14-15 Pt declined Flu Shot.

## 2015-08-14 NOTE — Telephone Encounter (Signed)
Pt had previously reported that he received flu vaccine in October 2016 and health maintenance was already completed.

## 2015-08-21 ENCOUNTER — Ambulatory Visit (INDEPENDENT_AMBULATORY_CARE_PROVIDER_SITE_OTHER): Payer: BLUE CROSS/BLUE SHIELD | Admitting: Medical

## 2015-08-21 ENCOUNTER — Other Ambulatory Visit (INDEPENDENT_AMBULATORY_CARE_PROVIDER_SITE_OTHER): Payer: BLUE CROSS/BLUE SHIELD

## 2015-08-21 VITALS — BP 135/80 | HR 89

## 2015-08-21 DIAGNOSIS — I10 Essential (primary) hypertension: Secondary | ICD-10-CM | POA: Diagnosis not present

## 2015-08-21 NOTE — Patient Instructions (Addendum)
Per Saguier, PA-C: Continue with current medication regimen. No follow-up appointment necessary at this time, however if the patient notices that blood pressure is elevating, contact the office to schedule a visit.

## 2015-08-21 NOTE — Progress Notes (Signed)
Pre visit review using our clinic review tool, if applicable. No additional management support is needed unless otherwise documented below in the visit note.  Patient presented in office for a blood pressure recheck. Today's readings were as follow: BP 140/76 P 83               BP 142/84 P 89  Final BP Reading was 135/80. Per Saguier, PA-C: Continue with current medication regimen. No follow-up appointment necessary at this time, however if the patient notices that blood pressure is elevating, contact the office to schedule a visit.  Informed patient of the provider's instructions. He understood and did not have any questions or concerns prior to the leaving the office.  I told pt after bp reading done by myself to check with bp  his machine every other day and document readings. Then in 2 weeks my chart the office/ his provider with readings. Want to consistently see levels less than 140/90. In fact earlier today before he came in he got similar reading that I got.

## 2015-08-22 ENCOUNTER — Encounter: Payer: Self-pay | Admitting: Family

## 2015-08-22 LAB — BASIC METABOLIC PANEL
BUN: 23 mg/dL (ref 6–23)
CHLORIDE: 100 meq/L (ref 96–112)
CO2: 28 mEq/L (ref 19–32)
Calcium: 9.2 mg/dL (ref 8.4–10.5)
Creatinine, Ser: 1.48 mg/dL (ref 0.40–1.50)
GFR: 53.54 mL/min — AB (ref >60.00)
Glucose, Bld: 92 mg/dL (ref 70–99)
POTASSIUM: 3.7 meq/L (ref 3.5–5.1)
SODIUM: 136 meq/L (ref 135–145)

## 2015-09-04 MED FILL — AMLODIPINE BESYLATE 10 MG T: 10 | 30 days supply | Qty: 15 | Fill #1

## 2015-09-04 MED FILL — HYDROCHLOROTHIAZIDE 25 MG T: 25 | 30 days supply | Qty: 30 | Fill #1

## 2015-09-04 MED FILL — LISINOPRIL 40 MG TABLET: 40 | 30 days supply | Qty: 30 | Fill #1

## 2015-09-06 MED FILL — LEVOTHYROXINE 50 MCG TABLET: 50 | 30 days supply | Qty: 30 | Fill #2

## 2015-10-04 MED FILL — LISINOPRIL 40 MG TABLET: 40 | 30 days supply | Qty: 30 | Fill #2

## 2015-10-04 MED FILL — AMLODIPINE BESYLATE 10 MG T: 10 | 30 days supply | Qty: 15 | Fill #2

## 2015-10-04 MED FILL — LEVOTHYROXINE 50 MCG TABLET: 50 | 30 days supply | Qty: 30 | Fill #3

## 2015-10-04 MED FILL — HYDROCHLOROTHIAZIDE 25 MG T: 25 | 30 days supply | Qty: 30 | Fill #2

## 2015-11-08 MED FILL — HYDROCHLOROTHIAZIDE 25 MG T: 25 | 30 days supply | Qty: 30 | Fill #3

## 2015-11-08 MED FILL — AMLODIPINE BESYLATE 10 MG T: 10 | 30 days supply | Qty: 15 | Fill #3

## 2015-11-08 MED FILL — LEVOTHYROXINE 50 MCG TABLET: 50 | 30 days supply | Qty: 30 | Fill #4

## 2015-11-08 MED FILL — LISINOPRIL 40 MG TABLET: 40 | 30 days supply | Qty: 30 | Fill #3

## 2015-12-02 ENCOUNTER — Encounter: Payer: Self-pay | Admitting: Family

## 2015-12-02 ENCOUNTER — Ambulatory Visit (INDEPENDENT_AMBULATORY_CARE_PROVIDER_SITE_OTHER): Payer: BLUE CROSS/BLUE SHIELD | Admitting: Family

## 2015-12-02 VITALS — BP 136/84 | HR 78 | Temp 98.9°F | Resp 20 | Ht 66.0 in | Wt 186.8 lb

## 2015-12-02 DIAGNOSIS — K219 Gastro-esophageal reflux disease without esophagitis: Secondary | ICD-10-CM | POA: Diagnosis not present

## 2015-12-02 DIAGNOSIS — E039 Hypothyroidism, unspecified: Secondary | ICD-10-CM | POA: Diagnosis not present

## 2015-12-02 DIAGNOSIS — I10 Essential (primary) hypertension: Secondary | ICD-10-CM

## 2015-12-02 MED ORDER — LISINOPRIL 40 MG PO TABS: 40.0000 mg | ORAL_TABLET | Freq: Every day | ORAL | Status: DC

## 2015-12-02 MED ORDER — AMLODIPINE BESYLATE 10 MG PO TABS: 5.0000 mg | ORAL_TABLET | Freq: Every day | ORAL | Status: DC

## 2015-12-02 MED ORDER — HYDROCHLOROTHIAZIDE 25 MG PO TABS: 25.0000 mg | ORAL_TABLET | Freq: Every day | ORAL | Status: DC

## 2015-12-02 MED ORDER — RANITIDINE HCL 150 MG PO TABS: 150.0000 mg | ORAL_TABLET | Freq: Two times a day (BID) | ORAL | Status: DC

## 2015-12-02 MED ORDER — LEVOTHYROXINE SODIUM 50 MCG PO TABS: 50.0000 ug | ORAL_TABLET | Freq: Every day | ORAL | Status: DC

## 2015-12-02 MED FILL — LISINOPRIL 40 MG TABLET: 40 | 30 days supply | Qty: 30 | Fill #0

## 2015-12-02 MED FILL — AMLODIPINE BESYLATE 10 MG T: 10 | 30 days supply | Qty: 15 | Fill #0

## 2015-12-02 MED FILL — HYDROCHLOROTHIAZIDE 25 MG T: 25 | 30 days supply | Qty: 30 | Fill #0

## 2015-12-02 MED FILL — raNITIdine HCL 150 MG TABS: 150 | 30 days supply | Qty: 60 | Fill #0

## 2015-12-02 NOTE — Assessment & Plan Note (Signed)
Stable on current meds, continue same, obtain bmet.

## 2015-12-02 NOTE — Assessment & Plan Note (Signed)
Obtain follow up tsh.  

## 2015-12-02 NOTE — Patient Instructions (Addendum)
Please complete lab work prior to leaving.   

## 2015-12-02 NOTE — Progress Notes (Signed)
Pre visit review using our clinic review tool, if applicable. No additional management support is needed unless otherwise documented below in the visit note. 

## 2015-12-02 NOTE — Progress Notes (Signed)
Subjective:    Patient ID: Austin Boyd, male    DOB: 1966-03-20, 50 y.o.   MRN: [id number]  HPI  Austin Boyd is a 50 yr old male who presents today for follow up.  1) GERD- maintained on zantac. Reports that reflux is well controlled.   2) Hypothyroid- maintained on synthroid 50 mcg. Reports that he sometimes feels tired.  Lab Results  Component Value Date   TSH 3.48 07/01/2015   3) HTN- maintained on amlodipine 5mg , hctz 25, and lisinopril 40mg . Denies swelling    Review of Systems See HPI  Past Medical History  Diagnosis Date  . Hypertension   . Thyroid disease   . Chicken pox   . Wears contact lenses   . Anxiety     scared of needles  . GERD (gastroesophageal reflux disease)   . PONV (postoperative nausea and vomiting)      Social History   Social History  . Marital Status: Married    Spouse Name: N/A  . Number of Children: N/A  . Years of Education: N/A   Occupational History  . Not on file.   Social History Main Topics  . Smoking status: Never Smoker   . Smokeless tobacco: Not on file  . Alcohol Use: No     Comment: social reports 4 beers a night  . Drug Use: No  . Sexual Activity: Not on file   Other Topics Concern  . 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 Wisdom tooth extraction    . Umbilical hernia repair N/A 06/13/2014    Procedure: HERNIA REPAIR UMBILICAL ADULT;  Surgeon: Donnie Mesa, MD;  Location: [place];  Service: General;  Laterality: N/A;  . Insertion of mesh N/A 06/13/2014    Procedure: INSERTION OF MESH;  Surgeon: Donnie Mesa, MD;  Location: [place];  Service: General;  Laterality: N/A;    Family History  Problem Relation Age of Onset  . Arthritis Mother     . Alcohol abuse Father   . Cancer Brother 40    Thyroid   . Heart disease Maternal Grandmother   . Stroke Maternal Grandmother   . Hypertension Maternal Grandmother   . Heart disease Maternal Grandfather   . Stroke Maternal Grandfather   . Hypertension Maternal Grandfather   . Hyperlipidemia Paternal Grandmother   . Hyperlipidemia Paternal Grandfather   . Multiple sclerosis Father     died in late 36's    No Known Allergies  Current Outpatient Prescriptions on File Prior to Visit  Medication Sig Dispense Refill  . amLODipine (NORVASC) 10 MG tablet Take 0.5 tablets (5 mg total) by mouth daily. 30 tablet 3  . hydrochlorothiazide (HYDRODIURIL) 25 MG tablet Take 1 tablet (25 mg total) by mouth daily. 30 tablet 3  . levothyroxine (SYNTHROID) 50 MCG tablet Take 1 tablet (50 mcg total) by mouth daily. 30 tablet 5  . lisinopril (PRINIVIL,ZESTRIL) 40 MG tablet Take 1 tablet (40 mg total) by mouth daily. 30 tablet 3  . ranitidine (ZANTAC) 150 MG tablet Take 1 tablet (150 mg total) by mouth 2 (two) times daily. 60 tablet 2   No current facility-administered medications on  file prior to visit.    BP 136/84 mmHg  Pulse 78  Temp(Src) 98.9 F (37.2 C) (Oral)  Resp 20  Ht 5\' 6"  (1.676 m)  Wt 186 lb 12.8 oz (84.732 kg)  BMI 30.16 kg/m2       Objective:   Physical Exam  Constitutional: He is oriented to person, place, and time. He appears well-developed and well-nourished. No distress.  HENT:  Head: Normocephalic and atraumatic.  Cardiovascular: Normal rate and regular rhythm.   No murmur heard. Pulmonary/Chest: Effort normal and breath sounds normal. No respiratory distress. He has no wheezes. He has no rales.  Musculoskeletal: He exhibits no edema.  Neurological: He is alert and oriented to person, place, and time.  Skin: Skin is warm and dry.  Psychiatric: He has a normal mood and affect. His behavior is normal. Thought content normal.          Assessment & Plan:

## 2015-12-02 NOTE — Assessment & Plan Note (Signed)
stable on zantac, continue same.

## 2015-12-03 ENCOUNTER — Telehealth: Payer: Self-pay | Admitting: Family

## 2015-12-03 LAB — BASIC METABOLIC PANEL
BUN: 25 mg/dL — AB (ref 6–23)
CALCIUM: 10.3 mg/dL (ref 8.4–10.5)
CO2: 28 mEq/L (ref 19–32)
Chloride: 101 mEq/L (ref 96–112)
Creatinine, Ser: 1.55 mg/dL — ABNORMAL HIGH (ref 0.40–1.50)
GFR: 50.7 mL/min — AB (ref >60.00)
GLUCOSE: 93 mg/dL (ref 70–99)
Potassium: 4.7 mEq/L (ref 3.5–5.1)
Sodium: 138 mEq/L (ref 135–145)

## 2015-12-03 LAB — TSH: TSH: 4.12 u[IU]/mL (ref 0.35–4.50)

## 2015-12-03 MED ORDER — LEVOTHYROXINE SODIUM 75 MCG PO TABS
75.0000 ug | ORAL_TABLET | Freq: Every day | ORAL | Status: DC
Start: 2015-12-03 — End: 2016-03-12

## 2015-12-03 MED FILL — LEVOTHYROXINE 75 MCG TABLET: 75 | 30 days supply | Qty: 30 | Fill #0

## 2015-12-03 NOTE — Telephone Encounter (Signed)
Labs show synthroid should be increased to 82  Mcg, rx sent to pharmacy. He can shred rx I gave him for 30mcg yesterday. Repeat tsh in 6 weeks. Kidney function is stable.

## 2015-12-04 NOTE — Telephone Encounter (Signed)
Left message for pt to return my call.

## 2015-12-05 ENCOUNTER — Other Ambulatory Visit: Payer: Self-pay | Admitting: Family

## 2015-12-05 DIAGNOSIS — E039 Hypothyroidism, unspecified: Secondary | ICD-10-CM

## 2015-12-05 NOTE — Telephone Encounter (Signed)
Pt returned call and would like to be call back.

## 2015-12-05 NOTE — Telephone Encounter (Signed)
Left detailed message on pt's cell# and to call the office to schedule lab appt in 6 weeks. Future order entered.

## 2016-01-09 MED FILL — raNITIdine HCL 150 MG TABS: 150 | 30 days supply | Qty: 60 | Fill #1

## 2016-01-09 MED FILL — HYDROCHLOROTHIAZIDE 25 MG T: 25 | 30 days supply | Qty: 30 | Fill #1

## 2016-01-09 MED FILL — AMLODIPINE BESYLATE 10 MG T: 10 | 30 days supply | Qty: 30 | Fill #1

## 2016-01-09 MED FILL — LISINOPRIL 40 MG TABLET: 40 | 30 days supply | Qty: 30 | Fill #1

## 2016-01-09 MED FILL — LEVOTHYROXINE 75 MCG TABLET: 75 | 30 days supply | Qty: 30 | Fill #1

## 2016-02-05 MED FILL — LISINOPRIL 40 MG TABLET: 40 | 30 days supply | Qty: 30 | Fill #2

## 2016-02-05 MED FILL — LEVOTHYROXINE 75 MCG TABLET: 75 | 30 days supply | Qty: 30 | Fill #2

## 2016-02-05 MED FILL — HYDROCHLOROTHIAZIDE 25 MG T: 25 | 30 days supply | Qty: 30 | Fill #2

## 2016-02-19 MED FILL — raNITIdine HCL 150 MG TABS: 150 | 30 days supply | Qty: 60 | Fill #2

## 2016-03-12 ENCOUNTER — Other Ambulatory Visit: Payer: Self-pay | Admitting: Family

## 2016-03-12 MED FILL — LISINOPRIL 40 MG TABLET: 40 | 30 days supply | Qty: 30 | Fill #3

## 2016-03-12 MED FILL — LEVOTHYROXINE 75 MCG TABLET: 75 | 30 days supply | Qty: 30 | Fill #0

## 2016-03-12 MED FILL — HYDROCHLOROTHIAZIDE 25 MG T: 25 | 30 days supply | Qty: 30 | Fill #3

## 2016-03-24 ENCOUNTER — Ambulatory Visit (INDEPENDENT_AMBULATORY_CARE_PROVIDER_SITE_OTHER): Payer: BLUE CROSS/BLUE SHIELD | Admitting: Medical

## 2016-03-24 ENCOUNTER — Encounter: Payer: Self-pay | Admitting: Medical

## 2016-03-24 ENCOUNTER — Ambulatory Visit (HOSPITAL_BASED_OUTPATIENT_CLINIC_OR_DEPARTMENT_OTHER)
Admission: RE | Admit: 2016-03-24 | Discharge: 2016-03-24 | Disposition: A | Discharge status: Home / Self Care | Payer: BLUE CROSS/BLUE SHIELD | Source: Ambulatory Visit | Attending: Medical | Admitting: Medical

## 2016-03-24 ENCOUNTER — Telehealth: Payer: Self-pay | Admitting: Medical

## 2016-03-24 VITALS — BP 142/80 | HR 81 | Temp 98.8°F | Ht 66.0 in | Wt 188.2 lb

## 2016-03-24 DIAGNOSIS — M47898 Other spondylosis, sacral and sacrococcygeal region: Secondary | ICD-10-CM | POA: Diagnosis not present

## 2016-03-24 DIAGNOSIS — R05 Cough: Secondary | ICD-10-CM | POA: Diagnosis not present

## 2016-03-24 DIAGNOSIS — J209 Acute bronchitis, unspecified: Secondary | ICD-10-CM | POA: Diagnosis not present

## 2016-03-24 DIAGNOSIS — R938 Abnormal findings on diagnostic imaging of other specified body structures: Secondary | ICD-10-CM | POA: Diagnosis not present

## 2016-03-24 DIAGNOSIS — R059 Cough, unspecified: Secondary | ICD-10-CM

## 2016-03-24 DIAGNOSIS — R062 Wheezing: Secondary | ICD-10-CM

## 2016-03-24 DIAGNOSIS — H6690 Otitis media, unspecified, unspecified ear: Secondary | ICD-10-CM | POA: Diagnosis not present

## 2016-03-24 MED ORDER — ALBUTEROL SULFATE HFA 108 (90 BASE) MCG/ACT IN AERS: 2.0000 | INHALATION_SPRAY | Freq: Four times a day (QID) | RESPIRATORY_TRACT | 0 refills | Status: DC | PRN

## 2016-03-24 MED ORDER — BENZONATATE 200 MG PO CAPS: 200.0000 mg | ORAL_CAPSULE | Freq: Two times a day (BID) | ORAL | 0 refills | Status: DC | PRN

## 2016-03-24 MED ORDER — PREDNISONE 10 MG PO TABS: ORAL_TABLET | ORAL | 0 refills | Status: DC

## 2016-03-24 MED ORDER — AMOXICILLIN-POT CLAVULANATE 875-125 MG PO TABS: 1.0000 | ORAL_TABLET | Freq: Two times a day (BID) | ORAL | 0 refills | Status: DC

## 2016-03-24 MED FILL — BENZONATATE 200 MG CAPSULE: 200 | 15 days supply | Qty: 30 | Fill #0

## 2016-03-24 MED FILL — VENTOLIN HFA 90 MCG INHALER: 108 (90 BAS | 30 days supply | Qty: 18 | Fill #0

## 2016-03-24 MED FILL — AMOX-CLAV 875-125 MG TABLET: 875-125 | 10 days supply | Qty: 20 | Fill #0

## 2016-03-24 NOTE — Patient Instructions (Addendum)
Your lt ear does look red and infected presently. Will rx augmentin antibiotic.This can help bronchitis as well.  For cough will rx benzonatate.  For you wheezing will rx albuterol inhaler. If you wheezing persists despite inhaler then may give brief taper prednisone.  Please get cxr today. I want to assess if bronchitis or if possible pneumonia.  Follow up 7 days or as needed.  Please notify me before weekend if wheezing is persisting or predominant symptom.

## 2016-03-24 NOTE — Progress Notes (Signed)
Pre visit review using our clinic tool,if applicable. No additional management support is needed unless otherwise documented below in the visit note.  

## 2016-03-24 NOTE — Progress Notes (Signed)
Subjective:    Patient ID: Austin Boyd, male    DOB: 11-29-1965, 50 y.o.   MRN: [id number]  HPI   Pt in for some recent chest congestion. He has been coughing a lot for one week. Wheezing at night. Cough is dry. Pt has no nasal congestion. No sinus pressure. Ear on left side mild pain recently last couple of days. No fever, no chills or sweats.   Pt states in past he was given an inhaler but that was years ago. Pt states he gets some occasional bronchitis.  He mentions one time given just med for wheezing. Did not get better with just inhaler and eventually needed antibiotics.   Pt does not that his left ear has been mild tender for 4 days.  No fever, no chills or sweats.   Review of Systems  Constitutional: Negative for chills, diaphoresis and fatigue.  HENT: Positive for ear pain. Negative for congestion, facial swelling, nosebleeds, postnasal drip, rhinorrhea, sinus pressure and sore throat.   Respiratory: Positive for cough and wheezing. Negative for chest tightness and shortness of breath.        Chest congestion.  Cardiovascular: Negative for chest pain and palpitations.  Gastrointestinal: Negative for abdominal pain, blood in stool, constipation, diarrhea, nausea and vomiting.  Musculoskeletal: Negative for back pain.  Skin: Negative for rash.  Neurological: Negative for dizziness, syncope, weakness and headaches.  Hematological: Negative for adenopathy. Does not bruise/bleed easily.  Psychiatric/Behavioral: Negative for agitation, confusion, dysphoric mood and self-injury.    Past Medical History:  Diagnosis Date  . Anxiety    scared of needles  . Chicken pox   . GERD (gastroesophageal reflux disease)   . Hypertension   . PONV (postoperative nausea and vomiting)   . Thyroid disease   . Wears contact lenses      Social History   Social History  . Marital status: Married    Spouse name: N/A  . Number of children: N/A  . Years of education: N/A    Occupational History  . Not on file.   Social History Main Topics  . Smoking status: Never Smoker  . Smokeless tobacco: Not on file  . Alcohol use No     Comment: social reports 4 beers a night  . Drug use: No  . Sexual activity: Not on file   Other Topics Concern  . 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 INSERTION OF MESH N/A 06/13/2014   Procedure: INSERTION OF MESH;  Surgeon: Donnie Mesa, MD;  Location: [facility];  Service: General;  Laterality: N/A;  . UMBILICAL HERNIA REPAIR N/A 06/13/2014   Procedure: HERNIA REPAIR UMBILICAL ADULT;  Surgeon: Donnie Mesa, MD;  Location: [facility];  Service: General;  Laterality: N/A;  . WISDOM TOOTH EXTRACTION      Family History  Problem Relation Age of Onset  . Arthritis Mother   . Alcohol abuse Father   . Cancer Brother 40    Thyroid   . Heart disease Maternal Grandmother   . Stroke Maternal Grandmother   . Hypertension Maternal Grandmother   . Heart disease Maternal Grandfather   . Stroke Maternal Grandfather   .  Hypertension Maternal Grandfather   . Hyperlipidemia Paternal Grandmother   . Hyperlipidemia Paternal Grandfather   . Multiple sclerosis Father     died in late 25's    No Known Allergies  Current Outpatient Prescriptions on File Prior to Visit  Medication Sig Dispense Refill  . amLODipine (NORVASC) 10 MG tablet Take 0.5 tablets (5 mg total) by mouth daily. 30 tablet 5  . hydrochlorothiazide (HYDRODIURIL) 25 MG tablet Take 1 tablet (25 mg total) by mouth daily. 30 tablet 5  . levothyroxine (SYNTHROID, LEVOTHROID) 75 MCG tablet TAKE 1 TABLET (75 MCG TOTAL) BY MOUTH DAILY. 30 tablet 5  . lisinopril (PRINIVIL,ZESTRIL) 40 MG tablet Take 1 tablet (40 mg  total) by mouth daily. 30 tablet 5  . ranitidine (ZANTAC) 150 MG tablet Take 1 tablet (150 mg total) by mouth 2 (two) times daily. 60 tablet 5   No current facility-administered medications on file prior to visit.     BP (!) 142/80   Pulse 81   Temp 98.8 F (37.1 C) (Oral)   Ht 5\' 6"  (1.676 m)   Wt 188 lb 3.2 oz (85.4 kg)   SpO2 98%   BMI 30.38 kg/m       Objective:   Physical Exam  General  Mental Status - Alert. General Appearance - Well groomed. Not in acute distress.  Skin Rashes- No Rashes.  HEENT Head- Normal. Ear Auditory Canal - Left- Normal. Right - Normal.Tympanic Membrane- Left- moderate bright red tm. Right- Normal. Eye Sclera/Conjunctiva- Left- Normal. Right- Normal. Nose & Sinuses Nasal Mucosa- Left-   Not Boggy or  Congested. Right-  Not Boggy or  Congested.Bilateral  No maxillary and  No frontal sinus pressure. Mouth & Throat Lips: Upper Lip- Normal: no dryness, cracking, pallor, cyanosis, or vesicular eruption. Lower Lip-Normal: no dryness, cracking, pallor, cyanosis or vesicular eruption. Buccal Mucosa- Bilateral- No Aphthous ulcers. Oropharynx- No Discharge or Erythema. Tonsils: Characteristics- Bilateral- No Erythema or Congestion. Size/Enlargement- Bilateral- No enlargement. Discharge- bilateral-None.  Neck Neck- Supple. No Masses.   Chest and Lung Exam Auscultation: Breath Sounds:- even and unlabored. Find mild shallow breathing.  Cardiovascular Auscultation:Rythm- Regular, rate and rhythm. Murmurs & Other Heart Sounds:Ausculatation of the heart reveal- No Murmurs.  Lymphatic Head & Neck General Head & Neck Lymphatics: Bilateral: Description- No Localized lymphadenopathy.       Assessment & Plan:  Your lt ear does look red and infected presently. Will rx augmentin antibiotic.This can help bronchitis as well.  For cough will rx benzonatate.  For you wheezing will rx albuterol inhaler. If you wheezing persists despite inhaler then  may give brief taper prednisone.  Please get cxr today. I want to assess if bronchitis or if possible pneumonia.  Follow up 7 days or as needed.  Please notify me before weekend if wheezing is persisting or predominant symptom.   Kysha Muralles, Percell Miller, PA-C

## 2016-03-24 NOTE — Telephone Encounter (Signed)
rx prednisone. Please make sure sent to pharmacy. Recent prednisone prints. So please check printer.

## 2016-03-25 MED FILL — predniSONE 10 MG TABS: 10 | 5 days supply | Qty: 15 | Fill #0

## 2016-03-26 ENCOUNTER — Telehealth: Payer: Self-pay

## 2016-03-26 NOTE — Telephone Encounter (Signed)
RX receipt for Prednisione confirmed by pharmcy.

## 2016-03-26 NOTE — Telephone Encounter (Signed)
I did send my chart message to pt. Please advise pt to review. Would you call pt and advise him to go ahead and pick up prednisone from the pharmacy.

## 2016-03-26 NOTE — Telephone Encounter (Signed)
-----   Message from Jenne Pane sent at 03/25/2016  5:31 PM EDT ----- Regarding: patient returning your call Contact: [phone] Patient returned your call regarding imaging. He requested that you send him a mychart message regarding this. Please advise.

## 2016-03-27 NOTE — Telephone Encounter (Signed)
Pt is aware.  

## 2016-03-31 MED FILL — raNITIdine HCL 150 MG TABS: 150 | 30 days supply | Qty: 60 | Fill #3

## 2016-04-15 MED FILL — HYDROCHLOROTHIAZIDE 25 MG T: 25 | 30 days supply | Qty: 30 | Fill #4

## 2016-04-15 MED FILL — LEVOTHYROXINE 75 MCG TABLET: 75 | 30 days supply | Qty: 30 | Fill #1

## 2016-04-15 MED FILL — AMLODIPINE BESYLATE 10 MG T: 10 | 30 days supply | Qty: 30 | Fill #2

## 2016-04-15 MED FILL — LISINOPRIL 40 MG TABLET: 40 | 30 days supply | Qty: 30 | Fill #4

## 2016-05-05 MED FILL — raNITIdine HCL 150 MG TABS: 150 | 30 days supply | Qty: 60 | Fill #4

## 2016-05-18 MED FILL — LISINOPRIL 40 MG TABLET: 40 | 30 days supply | Qty: 30 | Fill #5

## 2016-05-18 MED FILL — AMLODIPINE BESYLATE 10 MG T: 10 | 30 days supply | Qty: 30 | Fill #3

## 2016-05-18 MED FILL — LEVOTHYROXINE 75 MCG TABLET: 75 | 30 days supply | Qty: 30 | Fill #2

## 2016-05-18 MED FILL — HYDROCHLOROTHIAZIDE 25 MG T: 25 | 30 days supply | Qty: 30 | Fill #5

## 2016-06-12 MED FILL — raNITIdine HCL 150 MG TABS: 150 | 30 days supply | Qty: 60 | Fill #5

## 2016-06-23 ENCOUNTER — Telehealth: Payer: Self-pay | Admitting: Family

## 2016-06-23 MED FILL — LEVOTHYROXINE 75 MCG TABLET: 75 | 30 days supply | Qty: 30 | Fill #3

## 2016-06-23 MED FILL — HYDROCHLOROTHIAZIDE 25 MG T: 25 | 30 days supply | Qty: 30 | Fill #0

## 2016-06-23 MED FILL — LISINOPRIL 40 MG TABLET: 40 | 30 days supply | Qty: 30 | Fill #0

## 2016-06-23 NOTE — Telephone Encounter (Signed)
30 day supply of lisinopril and HCTZ sent to pharmacy. Pt last saw PCP 12/12/15 and was advised to schedule cpe in September. Pt is past due for follow up and needs to be seen for further refills. Please call pt to schedule CPE soon.  Thanks.

## 2016-06-24 NOTE — Telephone Encounter (Signed)
Called pt made him aware. Pt says that now isn't a good time to talk to schedule but  he will call us back to schedule his appt.

## 2016-07-07 ENCOUNTER — Encounter: Payer: BLUE CROSS/BLUE SHIELD | Admitting: Family

## 2016-07-13 ENCOUNTER — Encounter: Payer: Self-pay | Admitting: Family

## 2016-07-13 ENCOUNTER — Encounter: Payer: Self-pay | Admitting: Gastroenterology

## 2016-07-13 ENCOUNTER — Telehealth: Payer: Self-pay | Admitting: Internal Medicine

## 2016-07-13 ENCOUNTER — Ambulatory Visit (INDEPENDENT_AMBULATORY_CARE_PROVIDER_SITE_OTHER): Payer: BLUE CROSS/BLUE SHIELD | Admitting: Family

## 2016-07-13 VITALS — BP 130/92 | HR 82 | Temp 98.4°F | Resp 18 | Ht 66.0 in | Wt 186.6 lb

## 2016-07-13 DIAGNOSIS — Z0000 Encounter for general adult medical examination without abnormal findings: Secondary | ICD-10-CM | POA: Diagnosis not present

## 2016-07-13 DIAGNOSIS — D582 Other hemoglobinopathies: Secondary | ICD-10-CM

## 2016-07-13 LAB — HEPATIC FUNCTION PANEL
ALT: 85 U/L — ABNORMAL HIGH (ref 0–53)
AST: 44 U/L — ABNORMAL HIGH (ref 0–37)
Albumin: 4.8 g/dL (ref 3.5–5.2)
Alkaline Phosphatase: 43 U/L (ref 39–117)
Bilirubin, Direct: 0.2 mg/dL (ref 0.0–0.3)
Total Bilirubin: 0.8 mg/dL (ref 0.2–1.2)
Total Protein: 7.6 g/dL (ref 6.0–8.3)

## 2016-07-13 LAB — BASIC METABOLIC PANEL
BUN: 21 mg/dL (ref 6–23)
CALCIUM: 9.7 mg/dL (ref 8.4–10.5)
CO2: 27 mEq/L (ref 19–32)
CREATININE: 1.41 mg/dL (ref 0.40–1.50)
Chloride: 99 mEq/L (ref 96–112)
GFR: 56.41 mL/min — AB (ref >60.00)
Glucose, Bld: 101 mg/dL — ABNORMAL HIGH (ref 70–99)
POTASSIUM: 4.1 meq/L (ref 3.5–5.1)
Sodium: 134 mEq/L — ABNORMAL LOW (ref 135–145)

## 2016-07-13 LAB — URINALYSIS, ROUTINE W REFLEX MICROSCOPIC
Bilirubin Urine: NEGATIVE
Hgb urine dipstick: NEGATIVE
Ketones, ur: NEGATIVE
Leukocytes, UA: NEGATIVE
Nitrite: NEGATIVE
RBC / HPF: NONE SEEN (ref 0–?)
Specific Gravity, Urine: <=1.005 — AB (ref 1.000–1.030)
Total Protein, Urine: NEGATIVE
Urine Glucose: NEGATIVE
Urobilinogen, UA: 0.2 (ref 0.0–1.0)
pH: 6.5 (ref 5.0–8.0)

## 2016-07-13 LAB — CBC WITH DIFFERENTIAL/PLATELET
Basophils Absolute: 0 10*3/uL (ref 0.0–0.1)
Basophils Relative: 0.6 % (ref 0.0–3.0)
EOS ABS: 0.1 10*3/uL (ref 0.0–0.7)
EOS PCT: 1.5 % (ref 0.0–5.0)
HCT: 53.1 % — ABNORMAL HIGH (ref 39.0–52.0)
Hemoglobin: 18.5 g/dL (ref 13.0–17.0)
LYMPHS ABS: 0.8 10*3/uL (ref 0.7–4.0)
Lymphocytes Relative: 14.4 % (ref 12.0–46.0)
MCHC: 34.8 g/dL (ref 30.0–36.0)
MCV: 91.6 fl (ref 78.0–100.0)
MONO ABS: 0.6 10*3/uL (ref 0.1–1.0)
Monocytes Relative: 10.7 % (ref 3.0–12.0)
NEUTROS PCT: 72.8 % (ref 43.0–77.0)
Neutro Abs: 3.9 10*3/uL (ref 1.4–7.7)
Platelets: 241 10*3/uL (ref 150.0–400.0)
RBC: 5.8 Mil/uL (ref 4.22–5.81)
RDW: 13.2 % (ref 11.5–15.5)
WBC: 5.3 10*3/uL (ref 4.0–10.5)

## 2016-07-13 LAB — LIPID PANEL
CHOL/HDL RATIO: 3
CHOLESTEROL: 193 mg/dL (ref 0–200)
HDL: 65.9 mg/dL (ref >39.00)
LDL CALC: 107 mg/dL — AB (ref 0–99)
NonHDL: 127.45
TRIGLYCERIDES: 104 mg/dL (ref 0.0–149.0)
VLDL: 20.8 mg/dL (ref 0.0–40.0)

## 2016-07-13 LAB — TSH: TSH: 1.87 u[IU]/mL (ref 0.35–4.50)

## 2016-07-13 LAB — PSA: PSA: 1.62 ng/mL (ref 0.10–4.00)

## 2016-07-13 NOTE — Telephone Encounter (Signed)
Called by [facility] in the lab tonight - Austin Boyd's Hgb is elevated at 18.5.  Labs done for preventative visit.    I did not call patient since there were no complaints at today's visit.  No currently on testosterone.    Will let PCP manage tomorrow.

## 2016-07-13 NOTE — Patient Instructions (Signed)
Please complete lab work prior to leaving.   

## 2016-07-13 NOTE — Progress Notes (Signed)
Subjective:    Patient ID: Austin Boyd, male    DOB: [birth year], 51 y.o.   MRN: [id number]  HPI  Patient presents today for complete physical.  Immunizations: tetanus and flu up to date.  Diet: diet is improving Exercise:  Goes to the GYM, weight lifts 4-5 times a week. Not doing cardio.   Colonoscopy: never had colonoscopy Dental: up to date Vision: up to date Wt Readings from Last 3 Encounters:  07/13/16 186 lb 9.6 oz (84.6 kg)  03/24/16 188 lb 3.2 oz (85.4 kg)  12/02/15 186 lb 12.8 oz (84.7 kg)    Review of Systems  Constitutional: Negative for unexpected weight change.  HENT: Negative for hearing loss and rhinorrhea.   Respiratory: Negative for cough.   Cardiovascular: Negative for leg swelling.  Gastrointestinal: Negative for blood in stool, constipation and diarrhea.       Past Medical History:  Diagnosis Date  . Anxiety    scared of needles  . Chicken pox   . GERD (gastroesophageal reflux disease)   . Hypertension   . PONV (postoperative nausea and vomiting)   . Thyroid disease   . Wears contact lenses      Social History   Social History  . Marital status: Married    Spouse name: N/A  . Number of children: N/A  . Years of education: N/A   Occupational History  . Not on file.   Social History Main Topics  . Smoking status: Never Smoker  . Smokeless tobacco: Not on file  . Alcohol use No     Comment: social reports 4 beers a night  . Drug use: No  . Sexual activity: Not on file   Other Topics Concern  . 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 INSERTION OF MESH N/A 06/13/2014   Procedure: INSERTION OF MESH;  Surgeon: Donnie Mesa, MD;  Location: [facility];  Service: General;  Laterality: N/A;    . UMBILICAL HERNIA REPAIR N/A 06/13/2014   Procedure: HERNIA REPAIR UMBILICAL ADULT;  Surgeon: Donnie Mesa, MD;  Location: [facility];  Service: General;  Laterality: N/A;  . WISDOM TOOTH EXTRACTION      Family History  Problem Relation Age of Onset  . Arthritis Mother   . Alcohol abuse Father   . Multiple sclerosis Father     died in late 60's  . Cancer Brother 40    Thyroid   . Heart disease Maternal Grandmother   . Stroke Maternal Grandmother   . Hypertension Maternal Grandmother   . Heart disease Maternal Grandfather   . Stroke Maternal Grandfather   . Hypertension Maternal Grandfather   . Hyperlipidemia Paternal Grandmother   . Hyperlipidemia Paternal Grandfather     No Known Allergies  Current Outpatient Prescriptions on File Prior to Visit  Medication Sig Dispense Refill  . amLODipine (NORVASC) 10 MG tablet Take 0.5 tablets (5 mg total) by mouth daily. 30 tablet 5  . hydrochlorothiazide (HYDRODIURIL) 25 MG tablet TAKE 1 TABLET (25 MG TOTAL) BY MOUTH DAILY. 30 tablet 0  . levothyroxine (SYNTHROID, LEVOTHROID) 75 MCG tablet TAKE 1 TABLET (75 MCG TOTAL) BY MOUTH DAILY. 30 tablet 5  .  lisinopril (PRINIVIL,ZESTRIL) 40 MG tablet TAKE 1 TABLET (40 MG TOTAL) BY MOUTH DAILY. 30 tablet 0  . ranitidine (ZANTAC) 150 MG tablet Take 1 tablet (150 mg total) by mouth 2 (two) times daily. 60 tablet 5   No current facility-administered medications on file prior to visit.     BP (!) 130/92 (BP Location: Right Arm, Cuff Size: Large)   Pulse 82   Temp 98.4 F (36.9 C) (Oral)   Resp 18   Ht 5\' 6"  (1.676 m)   Wt 186 lb 9.6 oz (84.6 kg)   SpO2 100%   BMI 30.12 kg/m    Objective:   Physical Exam Physical Exam  Constitutional: Muscular appearing white male. He is oriented to person, place, and time. He appears well-developed and well-nourished. No distress.  HENT:  Head: Normocephalic and atraumatic.  Right Ear: Tympanic membrane and ear canal normal.  Left Ear:  Tympanic membrane and ear canal normal.  Mouth/Throat: Oropharynx is clear and moist.  Eyes: Pupils are equal, round, and reactive to light. No scleral icterus.  Neck: Normal range of motion. No thyromegaly present.  Cardiovascular: Normal rate and regular rhythm.   No murmur heard. Pulmonary/Chest: Effort normal and breath sounds normal. No respiratory distress. He has no wheezes. He has no rales. He exhibits no tenderness.  Abdominal: Soft. Bowel sounds are normal. He exhibits no distension and no mass. There is no tenderness. There is no rebound and no guarding.  Musculoskeletal: He exhibits no edema.  Lymphadenopathy:    He has no cervical adenopathy.  Neurological: He is alert and oriented to person, place, and time. He has normal patellar reflexes. He exhibits normal muscle tone. Coordination normal.  Skin: Skin is warm and dry.  Psychiatric: He has a normal mood and affect. His behavior is normal. Judgment and thought content normal.           Assessment & Plan:         Assessment & Plan:

## 2016-07-13 NOTE — Progress Notes (Signed)
Pre visit review using our clinic review tool, if applicable. No additional management support is needed unless otherwise documented below in the visit note. 

## 2016-07-13 NOTE — Assessment & Plan Note (Addendum)
Discussed healthy diet, exercise (adding regular cardio).  Will obtain routine lab work including PSA (discussed pros/cons).  Refer for colonoscopy. Tetanus/flu shot up to date. EKG is personally reviewed and compared to EKG 2012.  Again notes LAFB.  NSR, had normal echo last year, asymptomatic.

## 2016-07-14 NOTE — Telephone Encounter (Signed)
[facility] note received via fax  Call   Date: 07/13/16 Time: 5:38 pm   Caller:  Kenney Houseman from [facility]  Nurse:  Avon Gully, RN  Chief Complaint:  Lab Result   Reason for call:  Report lab results.  Spoke with Kenney Houseman with the lab and wanted to report a critical Hemoglobin drawn today 07/13/16 at 1615.  Hemoglobin is 18.2.  Last Hemoglobin value was noted to be 17.7 on 10/22/14.  Contact info for the patient is [phone].     Disposition:  Critical value was given to Dr. Quay Burow.  Please see previous note.

## 2016-07-16 ENCOUNTER — Other Ambulatory Visit: Payer: Self-pay | Admitting: Family

## 2016-07-16 NOTE — Telephone Encounter (Signed)
Please contact patient and let him know that his hemoglobin is high.  I would like to do some additional blood tests to see if I can determine why it is high.  Is he taking any testosterone supplements or iron supplements? If so, he should stop.

## 2016-07-16 NOTE — Telephone Encounter (Signed)
Called the patient no answer,  left message for patient to call the office.  PC

## 2016-07-17 ENCOUNTER — Encounter: Payer: Self-pay | Admitting: Family

## 2016-07-17 MED FILL — raNITIdine HCL 150 MG TABS: 150 | 30 days supply | Qty: 60 | Fill #0

## 2016-07-17 NOTE — Telephone Encounter (Signed)
See mychart message dated 07/17/16. Lab orders signed.

## 2016-07-17 NOTE — Telephone Encounter (Signed)
Left message for pt to return my call.

## 2016-07-22 IMAGING — US US EXTREM LOW VENOUS BILAT
1 series · 13 of 24 positions shown · non-contrast
Comparison: None.

CLINICAL DATA: Bilateral lower extremity edema for 2 days.



[Series 1: us extrem low venous bilat · 0.08mm/px · 50 acquisitions, 13 frames shown]
[im 1/50]
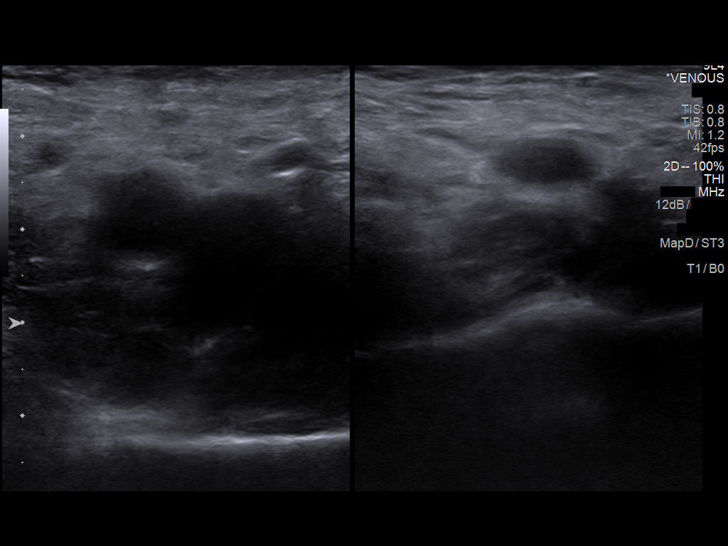
[im 5/50]
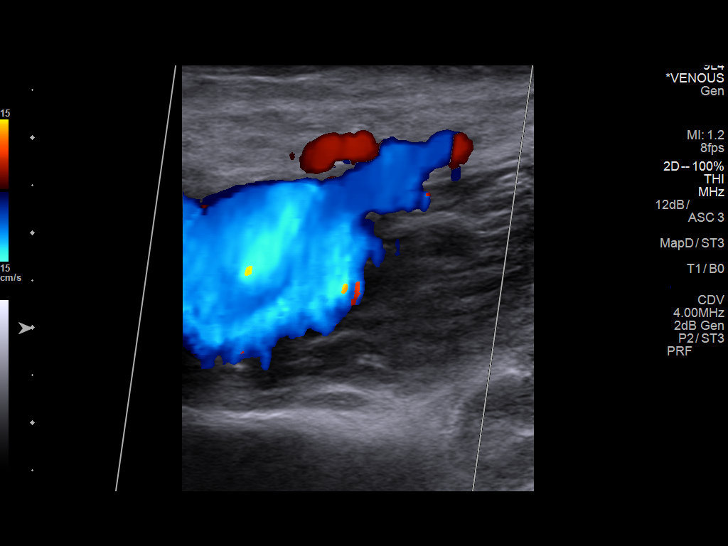
[im 9/50]
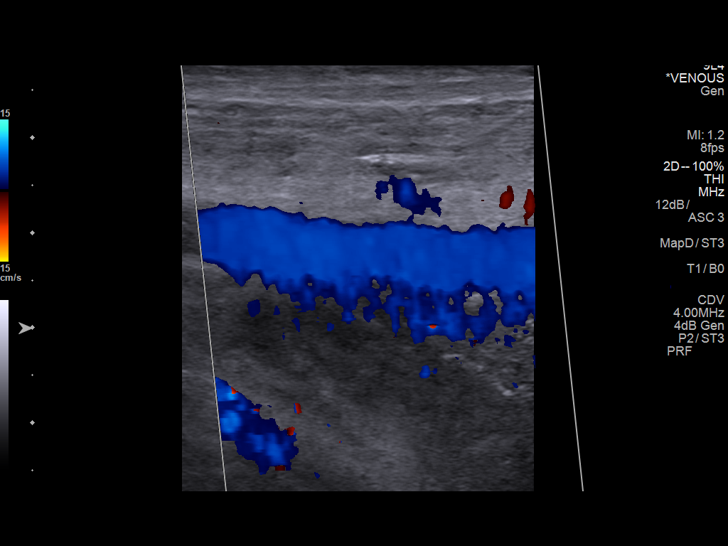
[im 13/50]
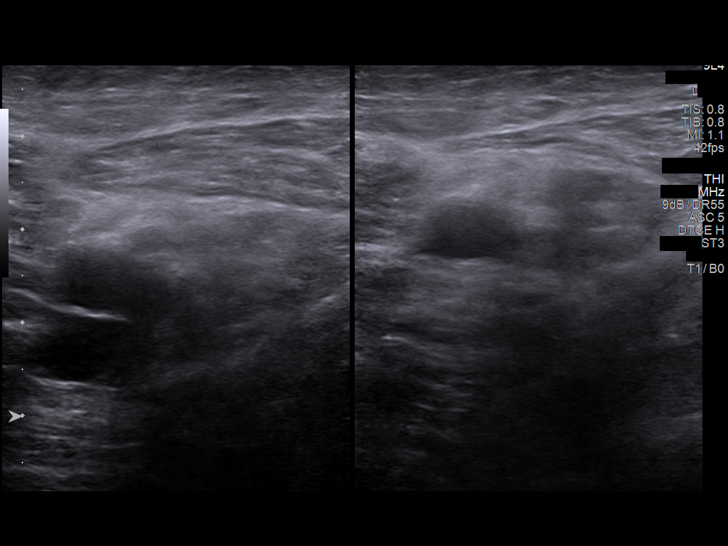
[im 18/50]
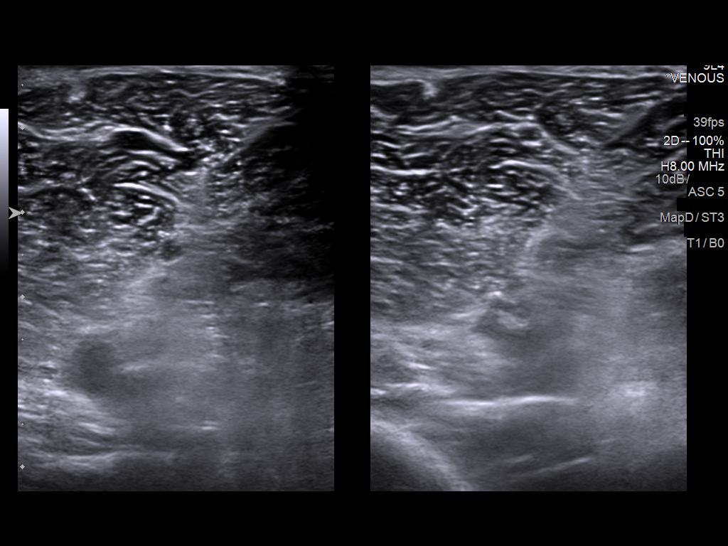
[im 20/50]
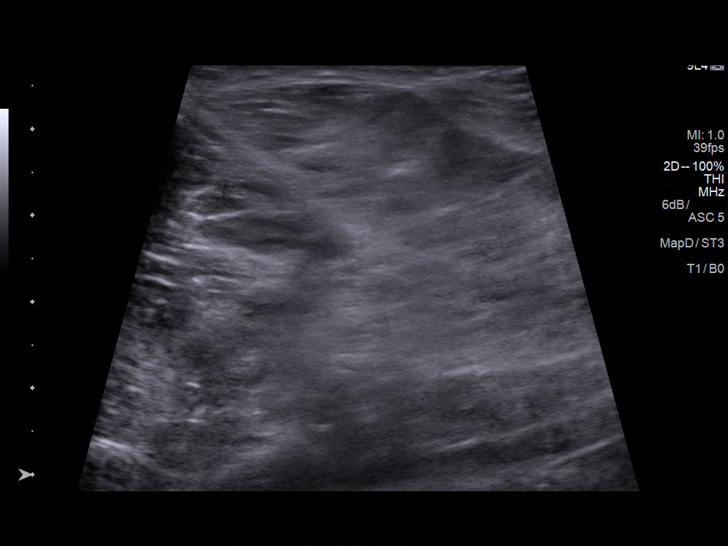
[im 26/50]
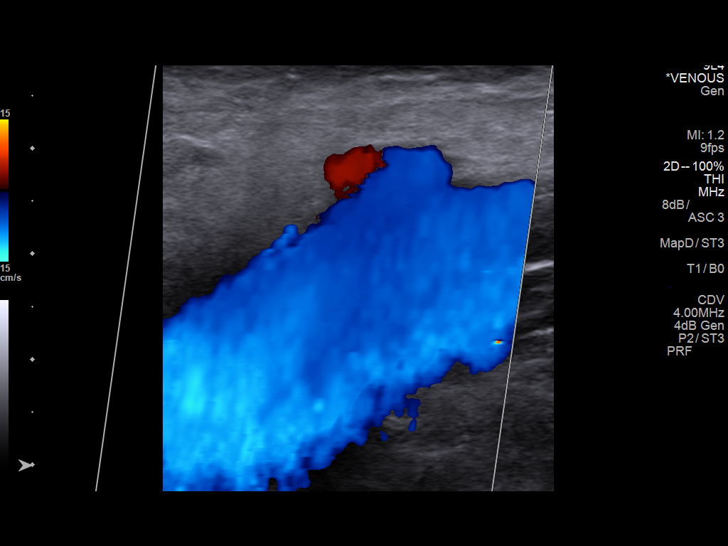
[im 28/50]
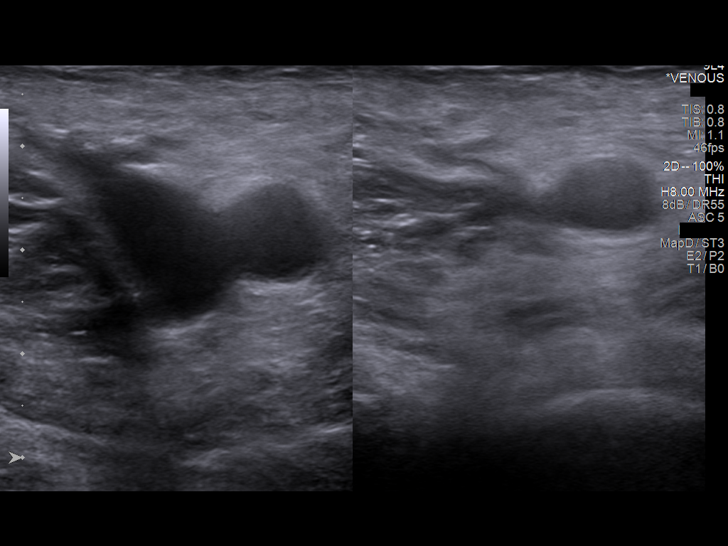
[im 32/50]
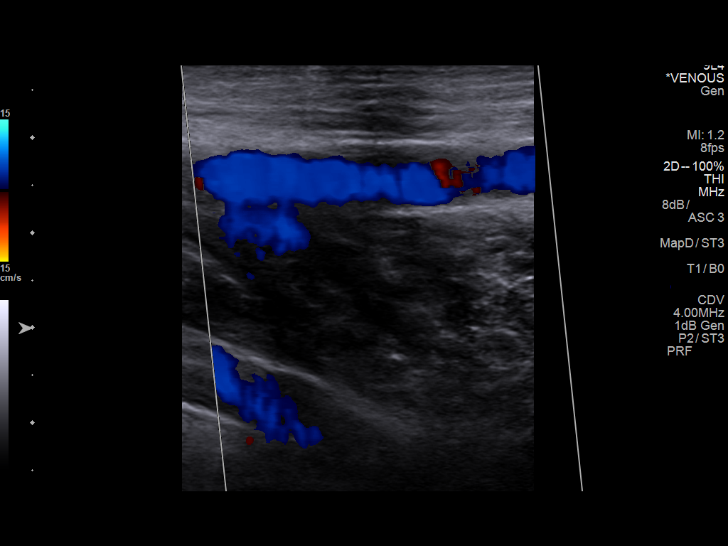
[im 37/50]
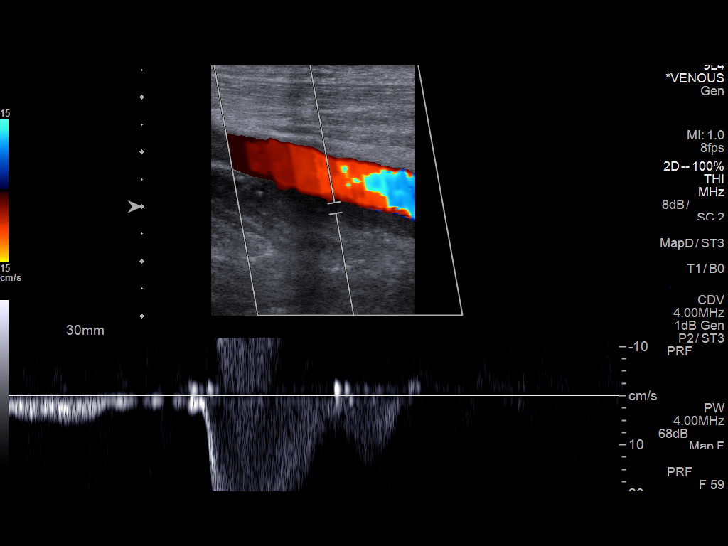
[im 41/50]
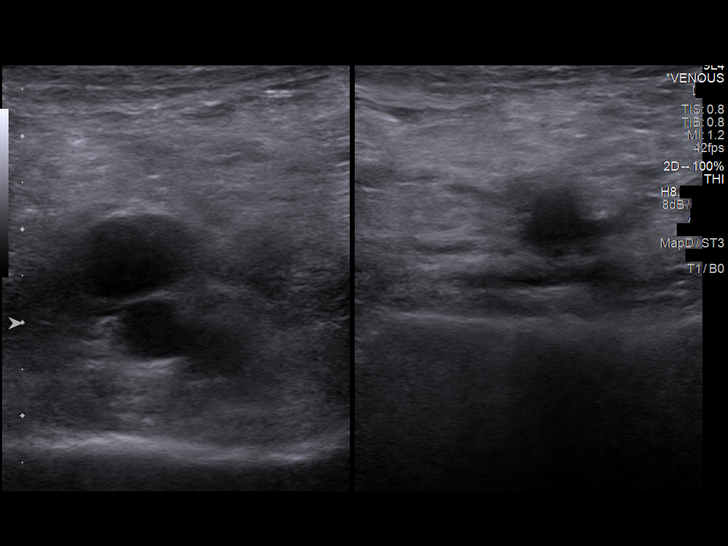
[im 45/50]
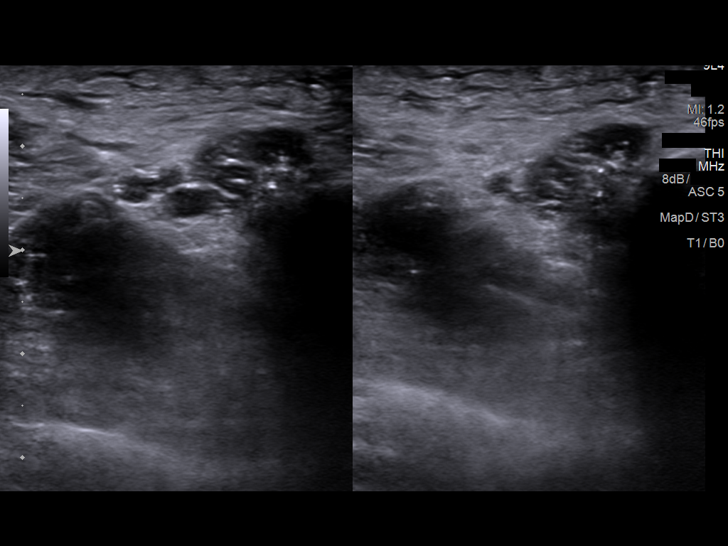
[im 50/50]
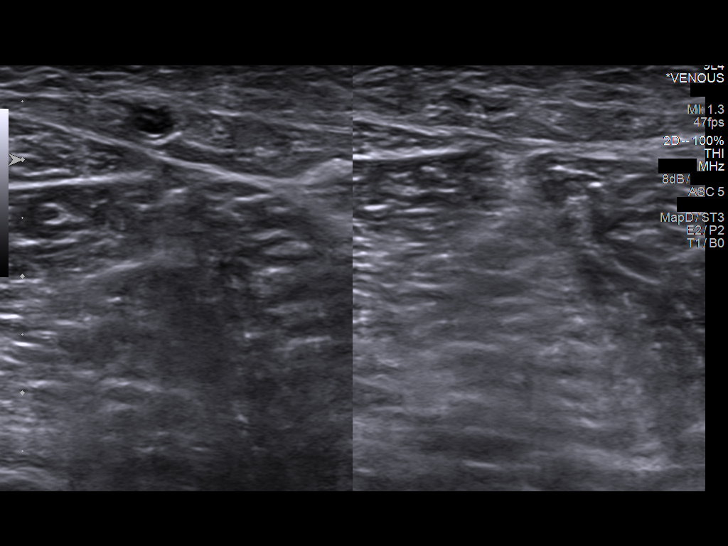

[13 of 24 positions shown; findings below may reference images not displayed]

FINDINGS: RIGHT LOWER EXTREMITY

Common Femoral Vein: No evidence of thrombus. Normal
compressibility, respiratory phasicity and response to augmentation.

Saphenofemoral Junction: No evidence of thrombus. Normal
compressibility and flow on color Doppler imaging.

Profunda Femoral Vein: No evidence of thrombus. Normal
compressibility and flow on color Doppler imaging.

Femoral Vein: Duplicated femoral vein is demonstrated consistent
with normal variation. No evidence of thrombus. Normal
compressibility, respiratory phasicity and response to augmentation.

Popliteal Vein: No evidence of thrombus. Normal compressibility,
respiratory phasicity and response to augmentation.

Calf Veins: No evidence of thrombus. Normal compressibility and flow
on color Doppler imaging.

Superficial Great Saphenous Vein: No evidence of thrombus. Normal
compressibility and flow on color Doppler imaging.

Venous Reflux:  None.

Other Findings:  None.

LEFT LOWER EXTREMITY

Common Femoral Vein: No evidence of thrombus. Normal
compressibility, respiratory phasicity and response to augmentation.

Saphenofemoral Junction: No evidence of thrombus. Normal
compressibility and flow on color Doppler imaging.

Profunda Femoral Vein: No evidence of thrombus. Normal
compressibility and flow on color Doppler imaging.

Femoral Vein: Give it femoral vein is demonstrated consistent with
normal variation. No evidence of thrombus. Normal compressibility,
respiratory phasicity and response to augmentation.

Popliteal Vein: No evidence of thrombus. Normal compressibility,
respiratory phasicity and response to augmentation.

Calf Veins: No evidence of thrombus. Normal compressibility and flow
on color Doppler imaging.

Superficial Great Saphenous Vein: No evidence of thrombus. Normal
compressibility and flow on color Doppler imaging.

Venous Reflux:  None.

Other Findings:  None.
IMPRESSION: No evidence of deep venous thrombosis.

## 2016-07-24 ENCOUNTER — Other Ambulatory Visit: Payer: Self-pay | Admitting: Family

## 2016-07-24 MED FILL — LEVOTHYROXINE 75 MCG TABLET: 75 | 30 days supply | Qty: 30 | Fill #4

## 2016-07-24 MED FILL — AMLODIPINE BESYLATE 10 MG T: 10 | 30 days supply | Qty: 30 | Fill #0

## 2016-07-24 MED FILL — LISINOPRIL 40 MG TABLET: 40 | 30 days supply | Qty: 30 | Fill #0

## 2016-07-24 MED FILL — HYDROCHLOROTHIAZIDE 25 MG T: 25 | 30 days supply | Qty: 30 | Fill #0

## 2016-07-27 ENCOUNTER — Other Ambulatory Visit (INDEPENDENT_AMBULATORY_CARE_PROVIDER_SITE_OTHER): Payer: BLUE CROSS/BLUE SHIELD

## 2016-07-27 DIAGNOSIS — D582 Other hemoglobinopathies: Secondary | ICD-10-CM

## 2016-07-28 LAB — FERRITIN: Ferritin: 304.1 ng/mL (ref 22.0–322.0)

## 2016-07-28 LAB — IRON: Iron: 115 ug/dL (ref 42–165)

## 2016-08-01 LAB — HEMOCHROMATOSIS DNA-PCR(C282Y,H63D)

## 2016-08-03 ENCOUNTER — Encounter: Payer: Self-pay | Admitting: Family

## 2016-08-03 ENCOUNTER — Telehealth: Payer: Self-pay | Admitting: Family

## 2016-08-03 DIAGNOSIS — Z148 Genetic carrier of other disease: Secondary | ICD-10-CM

## 2016-08-03 HISTORY — DX: Genetic carrier of other disease: Z14.8

## 2016-08-03 NOTE — Telephone Encounter (Signed)
Notified pt and he voices understanding. Lab appt scheduled for 11/03/16 at 4pm. Future orders entered.

## 2016-08-03 NOTE — Telephone Encounter (Signed)
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 Thanks.

## 2016-08-03 NOTE — Telephone Encounter (Signed)
Please let pt know that I did some additional testing and it shows that he one copy of a gene that can cause iron overload.   I would recommend that he limit red meat in his diet and avoid any iron containing supplements.   Repeat CBC and ferritin in 3 months, dx heterozygous for H63D mutation.

## 2016-08-03 NOTE — Telephone Encounter (Signed)
Left message for pt to return my call.

## 2016-08-25 MED FILL — raNITIdine HCL 150 MG TABS: 150 | 30 days supply | Qty: 60 | Fill #1

## 2016-08-26 ENCOUNTER — Ambulatory Visit (AMBULATORY_SURGERY_CENTER): Payer: Self-pay

## 2016-08-26 VITALS — Ht 65.5 in | Wt 186.4 lb

## 2016-08-26 DIAGNOSIS — Z1211 Encounter for screening for malignant neoplasm of colon: Secondary | ICD-10-CM

## 2016-08-26 MED ORDER — SUPREP BOWEL PREP KIT 17.5-3.13-1.6 GM/177ML PO SOLN: 1.0000 | Freq: Once | ORAL | 0 refills | Status: AC

## 2016-08-26 MED FILL — HYDROCHLOROTHIAZIDE 25 MG T: 25 | 30 days supply | Qty: 30 | Fill #1

## 2016-08-26 MED FILL — LISINOPRIL 40 MG TABLET: 40 | 30 days supply | Qty: 30 | Fill #1

## 2016-08-26 MED FILL — SUPREP BOWEL PREP KIT: 17.5-3.13-1 | 30 days supply | Qty: 354 | Fill #0

## 2016-08-26 MED FILL — LEVOTHYROXINE 75 MCG TABLET: 75 | 30 days supply | Qty: 30 | Fill #5

## 2016-08-26 NOTE — Progress Notes (Signed)
No diet meds No home oxygen No past problems with anesthesia; PONV with general No allergies eggs or soy  Declined emmi

## 2016-09-09 ENCOUNTER — Encounter: Payer: Self-pay | Admitting: Gastroenterology

## 2016-09-09 ENCOUNTER — Ambulatory Visit (AMBULATORY_SURGERY_CENTER): Payer: BLUE CROSS/BLUE SHIELD | Admitting: Gastroenterology

## 2016-09-09 VITALS — BP 128/79 | HR 64 | Temp 98.6°F | Resp 18 | Wt 186.0 lb

## 2016-09-09 DIAGNOSIS — Z1212 Encounter for screening for malignant neoplasm of rectum: Secondary | ICD-10-CM | POA: Diagnosis not present

## 2016-09-09 DIAGNOSIS — Z1211 Encounter for screening for malignant neoplasm of colon: Secondary | ICD-10-CM

## 2016-09-09 MED ORDER — SODIUM CHLORIDE 0.9 % IV SOLN: 500.0000 mL | INTRAVENOUS | Status: DC

## 2016-09-09 NOTE — Op Note (Signed)
[facility] Patient Name: Austin Boyd Procedure Date: 09/09/2016 11:31 AM MRN: [id number] Endoscopist: [place]. Loletha Carrow , MD Age: 51 Referring MD:  Date of Birth: June 18, 1965 Gender: Male Account #: [id number] Procedure:                Colonoscopy Indications:              Screening for colorectal malignant neoplasm, This                            is the patient's first colonoscopy Medicines:                Monitored Anesthesia Care Procedure:                Pre-Anesthesia Assessment:                           - Prior to the procedure, a History and Physical                            was performed, and patient medications and                            allergies were reviewed. The patient's tolerance of                            previous anesthesia was also reviewed. The risks                            and benefits of the procedure and the sedation                            options and risks were discussed with the patient.                            All questions were answered, and informed consent                            was obtained. Prior Anticoagulants: The patient has                            taken no previous anticoagulant or antiplatelet                            agents. ASA Grade Assessment: II - A patient with                            mild systemic disease. After reviewing the risks                            and benefits, the patient was deemed in                            satisfactory condition to undergo the procedure.  After obtaining informed consent, the colonoscope                            was passed under direct vision. Throughout the                            procedure, the patient's blood pressure, pulse, and                            oxygen saturations were monitored continuously. The                            Model CF-HQ190L [id number]) scope was introduced                            through the anus and  advanced to the the cecum,                            identified by appendiceal orifice and ileocecal                            valve. The colonoscopy was performed without                            difficulty. The patient tolerated the procedure                            well. The quality of the bowel preparation was                            good. The ileocecal valve, appendiceal orifice, and                            rectum were photographed. The quality of the bowel                            preparation was evaluated using the BBPS [facility]                            Bowel Preparation Scale) with scores of: Right                            Colon = 2, Transverse Colon = 2 and Left Colon = 2.                            The total BBPS score equals 6. Scope In: 11:44:32 AM Scope Out: 12:00:28 PM Scope Withdrawal Time: 0 hours 12 minutes 34 seconds  Total Procedure Duration: 0 hours 15 minutes 56 seconds  Findings:                 The perianal and digital rectal examinations were                            normal.  The entire examined colon appeared normal on direct                            and retroflexion views. Complications:            No immediate complications. Estimated Blood Loss:     Estimated blood loss: none. Impression:               - The entire examined colon is normal on direct and                            retroflexion views.                           - No specimens collected. Recommendation:           - Patient has a contact number available for                            emergencies. The signs and symptoms of potential                            delayed complications were discussed with the                            patient. Return to normal activities tomorrow.                            Written discharge instructions were provided to the                            patient.                           - Resume previous diet.                            - Continue present medications.                           - Repeat colonoscopy in 10 years for screening                            purposes. Henry L. Loletha Carrow, MD 09/09/2016 12:02:59 PM This report has been signed electronically.

## 2016-09-09 NOTE — Progress Notes (Signed)
A and O x3. Report to RN. Tolerated MAC anesthesia well.

## 2016-09-09 NOTE — Patient Instructions (Signed)
YOU HAD AN ENDOSCOPIC PROCEDURE TODAY AT THE [facility]:   Refer to the procedure report that was given to you for any specific questions about what was found during the examination.  If the procedure report does not answer your questions, please call your gastroenterologist to clarify.  If you requested that your care partner not be given the details of your procedure findings, then the procedure report has been included in a sealed envelope for you to review at your convenience later.  YOU SHOULD EXPECT: Some feelings of bloating in the abdomen. Passage of more gas than usual.  Walking can help get rid of the air that was put into your GI tract during the procedure and reduce the bloating. If you had a lower endoscopy (such as a colonoscopy or flexible sigmoidoscopy) you may notice spotting of blood in your stool or on the toilet paper. If you underwent a bowel prep for your procedure, you may not have a normal bowel movement for a few days.  Please Note:  You might notice some irritation and congestion in your nose or some drainage.  This is from the oxygen used during your procedure.  There is no need for concern and it should clear up in a day or so.  SYMPTOMS TO REPORT IMMEDIATELY:   Following lower endoscopy (colonoscopy or flexible sigmoidoscopy):  Excessive amounts of blood in the stool  Significant tenderness or worsening of abdominal pains  Swelling of the abdomen that is new, acute  Fever of 100F or higher  For urgent or emergent issues, a gastroenterologist can be reached at any hour by calling [phone].   DIET:  We do recommend a small meal at first, but then you may proceed to your regular diet.  Drink plenty of fluids but you should avoid alcoholic beverages for 24 hours.  ACTIVITY:  You should plan to take it easy for the rest of today and you should NOT DRIVE or use heavy machinery until tomorrow (because of the sedation medicines used during the test).     FOLLOW UP: Our staff will call the number listed on your records the next business day following your procedure to check on you and address any questions or concerns that you may have regarding the information given to you following your procedure. If we do not reach you, we will leave a message.  However, if you are feeling well and you are not experiencing any problems, there is no need to return our call.  We will assume that you have returned to your regular daily activities without incident.   SIGNATURES/CONFIDENTIALITY: You and/or your care partner have signed paperwork which will be entered into your electronic medical record.  These signatures attest to the fact that that the information above on your After Visit Summary has been reviewed and is understood.  Full responsibility of the confidentiality of this discharge information lies with you and/or your care-partner.  Next colonoscopy- 10 years  Continue your normal medications 

## 2016-09-10 ENCOUNTER — Telehealth: Payer: Self-pay | Admitting: *Deleted

## 2016-09-10 NOTE — Telephone Encounter (Signed)
  Follow up Call-  Call back number 09/09/2016  Post procedure Call Back phone  # [phone]  Permission to leave phone message Yes   [facility]

## 2016-09-10 NOTE — Telephone Encounter (Signed)
Message left

## 2016-09-28 ENCOUNTER — Other Ambulatory Visit: Payer: Self-pay | Admitting: Family

## 2016-09-28 MED FILL — AMLODIPINE BESYLATE 10 MG T: 10 | 30 days supply | Qty: 30 | Fill #1

## 2016-09-28 MED FILL — raNITIdine HCL 150 MG TABS: 150 | 30 days supply | Qty: 60 | Fill #2

## 2016-09-28 MED FILL — LISINOPRIL 40 MG TABLET: 40 | 30 days supply | Qty: 30 | Fill #2

## 2016-09-28 MED FILL — HYDROCHLOROTHIAZIDE 25 MG T: 25 | 30 days supply | Qty: 30 | Fill #2

## 2016-09-28 MED FILL — LEVOTHYROXINE 75 MCG TABLET: 75 | 30 days supply | Qty: 30 | Fill #0

## 2016-09-28 NOTE — Telephone Encounter (Signed)
eScribe request from [facility] for refill on Levothyroxine 75 mcg Last filled - 03/12/16, #30x5 Last AEX - 07/13/16 Next AEX - 6-Mths Refill sent per [facility] refill protocol/SLS

## 2016-11-03 ENCOUNTER — Other Ambulatory Visit: Payer: BLUE CROSS/BLUE SHIELD

## 2016-11-03 MED FILL — HYDROCHLOROTHIAZIDE 25 MG T: 25 | 30 days supply | Qty: 30 | Fill #3

## 2016-11-03 MED FILL — LEVOTHYROXINE 75 MCG TABLET: 75 | 30 days supply | Qty: 30 | Fill #1

## 2016-11-03 MED FILL — raNITIdine HCL 150 MG TABS: 150 | 30 days supply | Qty: 60 | Fill #3

## 2016-11-03 MED FILL — LISINOPRIL 40 MG TABLET: 40 | 30 days supply | Qty: 30 | Fill #3

## 2016-11-26 MED FILL — raNITIdine HCL 150 MG TABS: 150 | 30 days supply | Qty: 60 | Fill #4

## 2016-11-26 MED FILL — LISINOPRIL 40 MG TABLET: 40 | 30 days supply | Qty: 30 | Fill #4

## 2016-11-26 MED FILL — HYDROCHLOROTHIAZIDE 25 MG T: 25 | 30 days supply | Qty: 30 | Fill #4

## 2016-11-26 MED FILL — LEVOTHYROXINE 75 MCG TABLET: 75 | 30 days supply | Qty: 30 | Fill #2

## 2017-01-05 MED FILL — LEVOTHYROXINE 75 MCG TABLET: 75 | 30 days supply | Qty: 30 | Fill #3

## 2017-01-05 MED FILL — raNITIdine HCL 150 MG TABS: 150 | 30 days supply | Qty: 60 | Fill #5

## 2017-01-05 MED FILL — LISINOPRIL 40 MG TAB: 40 | 30 days supply | Qty: 30 | Fill #5

## 2017-01-05 MED FILL — HYDROCHLOROTHIAZIDE 25 MG T: 25 | 30 days supply | Qty: 30 | Fill #5

## 2017-01-05 MED FILL — AMLODIPINE BESYLATE 10 MG T: 10 | 30 days supply | Qty: 30 | Fill #2

## 2017-01-11 ENCOUNTER — Ambulatory Visit: Payer: BLUE CROSS/BLUE SHIELD | Admitting: Family

## 2017-01-18 ENCOUNTER — Encounter: Payer: Self-pay | Admitting: Family

## 2017-01-18 ENCOUNTER — Other Ambulatory Visit: Payer: BLUE CROSS/BLUE SHIELD

## 2017-01-18 ENCOUNTER — Ambulatory Visit (INDEPENDENT_AMBULATORY_CARE_PROVIDER_SITE_OTHER): Payer: BLUE CROSS/BLUE SHIELD | Admitting: Family

## 2017-01-18 VITALS — BP 151/83 | HR 77 | Temp 99.0°F | Resp 18 | Ht 66.0 in | Wt 186.4 lb

## 2017-01-18 DIAGNOSIS — E039 Hypothyroidism, unspecified: Secondary | ICD-10-CM

## 2017-01-18 DIAGNOSIS — Z148 Genetic carrier of other disease: Secondary | ICD-10-CM | POA: Diagnosis not present

## 2017-01-18 DIAGNOSIS — I10 Essential (primary) hypertension: Secondary | ICD-10-CM | POA: Diagnosis not present

## 2017-01-18 DIAGNOSIS — K219 Gastro-esophageal reflux disease without esophagitis: Secondary | ICD-10-CM

## 2017-01-18 NOTE — Progress Notes (Signed)
Subjective:    Patient ID: Austin Boyd, male    DOB: Jan 01, 1966, 51 y.o.   MRN: [id number]  HPI   Mr. Wimberly is a 51 yr old male who presents today for follow up.  1) HTN-  Denies, cp/sob or swelling.   BP Readings from Last 3 Encounters:  01/18/17 (!) 151/83  09/09/16 128/79  07/13/16 (!) 130/92   2) Hypothyroid- reports feeling well on synthroid. Lab Results  Component Value Date   TSH 1.87 07/13/2016   3) GERD- continues zantac. Reports symptoms are stable on zantac.     Review of Systems    see HPI  Past Medical History:  Diagnosis Date  . Anxiety    scared of needles  . Chicken pox   . GERD (gastroesophageal reflux disease)   . 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 Hypertension   . PONV (postoperative nausea and vomiting)   . Thyroid disease   . Wears contact lenses      Social History   Social History  . Marital status: Married    Spouse name: N/A  . Number of children: N/A  . Years of education: N/A   Occupational History  . Not on file.   Social History Main Topics  . Smoking status: Never Smoker  . Smokeless tobacco: Never Used  . Alcohol use 25.2 oz/week    42 Cans of beer per week  . Drug use: No  . Sexual activity: Not on file   Other Topics Concern  . 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 INSERTION OF MESH N/A 06/13/2014   Procedure: INSERTION OF MESH;  Surgeon: Donnie Mesa, MD;  Location: [place];  Service: General;  Laterality: N/A;  . UMBILICAL HERNIA REPAIR N/A 06/13/2014   Procedure: HERNIA REPAIR UMBILICAL ADULT;  Surgeon: Donnie Mesa, MD;  Location: [place];  Service: General;  Laterality: N/A;  . WISDOM TOOTH  EXTRACTION      Family History  Problem Relation Age of Onset  . Arthritis Mother   . Alcohol abuse Father   . Multiple sclerosis Father        died in late 25's  . Cancer Brother 40       Thyroid   . Heart disease Maternal Grandmother   . Stroke Maternal Grandmother   . Hypertension Maternal Grandmother   . Heart disease Maternal Grandfather   . Stroke Maternal Grandfather   . Hypertension Maternal Grandfather   . Hyperlipidemia Paternal Grandmother   . Hyperlipidemia Paternal Grandfather   . Colon cancer Neg Hx     No Known Allergies  Current Outpatient Prescriptions on File Prior to Visit  Medication Sig Dispense Refill  . amLODipine (NORVASC) 10 MG tablet TAKE 1 TABLET (10 MG TOTAL) BY MOUTH DAILY. 90 tablet 1  . hydrochlorothiazide (HYDRODIURIL) 25 MG tablet TAKE 1 TABLET (25 MG TOTAL) BY MOUTH DAILY. *PT NEED OFFICE VISIT* 90 tablet 1  . levothyroxine (SYNTHROID, LEVOTHROID) 75 MCG tablet TAKE 1 TABLET (75 MCG TOTAL) BY MOUTH DAILY. 30 tablet 5  . lisinopril (PRINIVIL,ZESTRIL) 40 MG tablet TAKE 1 TABLET (40 MG TOTAL) BY MOUTH DAILY. *  PT NEED OFFICE VISIT* 90 tablet 1  . ranitidine (ZANTAC) 150 MG tablet TAKE 1 TABLET (150 MG TOTAL) BY MOUTH 2 TIMES DAILY. 60 tablet 5   Current Facility-Administered Medications on File Prior to Visit  Medication Dose Route Frequency Provider Last Rate Last Dose  . 0.9 %  sodium chloride infusion  500 mL Intravenous Continuous Danis, Estill Cotta III, MD        BP (!) 151/83 (BP Location: Right Arm, Cuff Size: Large)   Pulse 77   Temp 99 F (37.2 C) (Oral)   Resp 18   Ht 5\' 6"  (1.676 m)   Wt 186 lb 6.4 oz (84.6 kg)   SpO2 99%   BMI 30.09 kg/m    Objective:   Physical Exam  Constitutional: He is oriented to person, place, and time. He appears well-developed and well-nourished. No distress.  HENT:  Head: Normocephalic and atraumatic.  Cardiovascular: Normal rate and regular rhythm.   No murmur heard. Pulmonary/Chest: Effort normal  and breath sounds normal. No respiratory distress. He has no wheezes. He has no rales.  Musculoskeletal: He exhibits no edema.  Neurological: He is alert and oriented to person, place, and time.  Skin: Skin is warm and dry.  Psychiatric: He has a normal mood and affect. His behavior is normal. Thought content normal.          Assessment & Plan:  HTN- BP was repeated and was improved. Advised pt to continue current meds and follow up in 3 months.  Hypothyroid- obtain follow up TSH, clinically stable on synthroid.  GERD- stable on zantac, continue same.   Hemochromatosis carrier- obtain follow up ferritin level.

## 2017-01-19 ENCOUNTER — Telehealth: Payer: Self-pay | Admitting: Family

## 2017-01-19 LAB — BASIC METABOLIC PANEL
BUN: 23 mg/dL (ref 6–23)
CHLORIDE: 101 meq/L (ref 96–112)
CO2: 28 mEq/L (ref 19–32)
Calcium: 9.4 mg/dL (ref 8.4–10.5)
Creatinine, Ser: 1.59 mg/dL — ABNORMAL HIGH (ref 0.40–1.50)
GFR: 49.01 mL/min — ABNORMAL LOW (ref >60.00)
Glucose, Bld: 104 mg/dL — ABNORMAL HIGH (ref 70–99)
POTASSIUM: 4 meq/L (ref 3.5–5.1)
Sodium: 137 mEq/L (ref 135–145)

## 2017-01-19 LAB — FERRITIN: FERRITIN: 315.1 ng/mL (ref 22.0–322.0)

## 2017-01-19 LAB — TSH: TSH: 2.13 u[IU]/mL (ref 0.35–4.50)

## 2017-01-19 LAB — CBC WITH DIFFERENTIAL/PLATELET
BASOS ABS: 0.1 10*3/uL (ref 0.0–0.1)
Basophils Relative: 0.8 % (ref 0.0–3.0)
EOS ABS: 0.1 10*3/uL (ref 0.0–0.7)
EOS PCT: 1.4 % (ref 0.0–5.0)
HCT: 49.8 % (ref 39.0–52.0)
HEMOGLOBIN: 16.6 g/dL (ref 13.0–17.0)
LYMPHS ABS: 1 10*3/uL (ref 0.7–4.0)
Lymphocytes Relative: 15.6 % (ref 12.0–46.0)
MCHC: 33.3 g/dL (ref 30.0–36.0)
MCV: 94.5 fl (ref 78.0–100.0)
MONO ABS: 0.6 10*3/uL (ref 0.1–1.0)
Monocytes Relative: 9.8 % (ref 3.0–12.0)
Neutro Abs: 4.6 10*3/uL (ref 1.4–7.7)
Neutrophils Relative %: 72.4 % (ref 43.0–77.0)
Platelets: 243 10*3/uL (ref 150.0–400.0)
RBC: 5.28 Mil/uL (ref 4.22–5.81)
RDW: 13.7 % (ref 11.5–15.5)
WBC: 6.4 10*3/uL (ref 4.0–10.5)

## 2017-01-19 NOTE — Telephone Encounter (Signed)
-----   Message from Mosie Lukes, MD sent at 01/19/2017  9:35 PM EDT ----- I would also check and make sure he is not taking protein shakes, weight loss supplements, body building supplements a lot of these things in them have creatinine which can cause this. Also is he drinking enough fluids. Then recheck in a month if continuing to climb would do a renal ultrasound.  ----- Message ----- From: Debbrah Alar, NP Sent: 01/19/2017   5:24 PM To: Mosie Lukes, MD  Please see patient's renal function testing from yesterday. He has a history of hypertension. This may be cause for his renal insufficiency. Would you do further workup for which he just watch this? I will double check that he is not taking NSAIDs.  Thanks,  [employer]

## 2017-01-19 NOTE — Telephone Encounter (Signed)
Please contact patient and let him know that his kidney function is mildly abnormal. Before meals taking any protein shakes or supplements? Also is he taking any NSAIDs? If so he should discontinue. Ensure adequate fluid intake. Repeat basic metabolic panel in 1 month. Diagnosis elevated creatinine.

## 2017-01-20 ENCOUNTER — Other Ambulatory Visit: Payer: Self-pay | Admitting: Family

## 2017-01-20 DIAGNOSIS — R7989 Other specified abnormal findings of blood chemistry: Secondary | ICD-10-CM

## 2017-01-20 NOTE — Telephone Encounter (Signed)
Patient notified

## 2017-02-04 ENCOUNTER — Other Ambulatory Visit: Payer: Self-pay | Admitting: Family

## 2017-02-04 MED FILL — raNITIdine HCL 150 MG TABS: 150 | 30 days supply | Qty: 60 | Fill #0

## 2017-02-04 MED FILL — AMLODIPINE BESYLATE 10 MG T: 10 | 30 days supply | Qty: 30 | Fill #3

## 2017-02-04 MED FILL — HYDROCHLOROTHIAZIDE 25 MG T: 25 | 30 days supply | Qty: 30 | Fill #0

## 2017-02-04 MED FILL — LISINOPRIL 40 MG TAB: 40 | 30 days supply | Qty: 30 | Fill #0

## 2017-02-04 MED FILL — LEVOTHYROXINE 75 MCG TABLET: 75 | 30 days supply | Qty: 30 | Fill #4

## 2017-02-19 ENCOUNTER — Other Ambulatory Visit (INDEPENDENT_AMBULATORY_CARE_PROVIDER_SITE_OTHER): Payer: BLUE CROSS/BLUE SHIELD

## 2017-02-19 DIAGNOSIS — R7989 Other specified abnormal findings of blood chemistry: Secondary | ICD-10-CM

## 2017-02-20 LAB — BASIC METABOLIC PANEL WITH GFR
BUN/Creatinine Ratio: 10 (calc) (ref 6–22)
BUN: 16 mg/dL (ref 7–25)
CALCIUM: 9.6 mg/dL (ref 8.6–10.3)
CHLORIDE: 102 mmol/L (ref 98–110)
CO2: 27 mmol/L (ref 20–32)
Creat: 1.6 mg/dL — ABNORMAL HIGH (ref 0.70–1.33)
GFR, Est African American: 57 mL/min/{1.73_m2} — ABNORMAL LOW (ref >=60)
GFR, Est Non African American: 49 mL/min/{1.73_m2} — ABNORMAL LOW (ref >=60)
GLUCOSE: 115 mg/dL — AB (ref 65–99)
POTASSIUM: 4 mmol/L (ref 3.5–5.3)
Sodium: 137 mmol/L (ref 135–146)

## 2017-02-22 ENCOUNTER — Telehealth: Payer: Self-pay | Admitting: Family

## 2017-02-22 DIAGNOSIS — R739 Hyperglycemia, unspecified: Secondary | ICD-10-CM

## 2017-02-22 DIAGNOSIS — N289 Disorder of kidney and ureter, unspecified: Secondary | ICD-10-CM

## 2017-02-22 NOTE — Telephone Encounter (Signed)
Please let pt know that his kidney test is unchanged, still mildly abnormal.  I would like him to complete a renal ultrasound to take a closer look at his kidneys.

## 2017-02-22 NOTE — Telephone Encounter (Signed)
Notified pt and he is agreeable to proceed with u/s and lab. Lab order entered and scheduled for 02/24/17 at 3:45pm and u/s at 4:30pm.

## 2017-02-22 NOTE — Telephone Encounter (Signed)
Also, sugar is mildly elevated. I would like him to complete A1C, dx hyperglycemia.

## 2017-02-24 ENCOUNTER — Other Ambulatory Visit (INDEPENDENT_AMBULATORY_CARE_PROVIDER_SITE_OTHER): Payer: BLUE CROSS/BLUE SHIELD

## 2017-02-24 ENCOUNTER — Ambulatory Visit (HOSPITAL_BASED_OUTPATIENT_CLINIC_OR_DEPARTMENT_OTHER)
Admission: RE | Admit: 2017-02-24 | Discharge: 2017-02-24 | Disposition: A | Discharge status: Home / Self Care | Payer: BLUE CROSS/BLUE SHIELD | Source: Ambulatory Visit | Attending: Family | Admitting: Family

## 2017-02-24 DIAGNOSIS — N289 Disorder of kidney and ureter, unspecified: Secondary | ICD-10-CM | POA: Diagnosis not present

## 2017-02-24 DIAGNOSIS — R739 Hyperglycemia, unspecified: Secondary | ICD-10-CM

## 2017-02-25 LAB — HEMOGLOBIN A1C: Hgb A1c MFr Bld: 5.6 % (ref 4.6–6.5)

## 2017-02-26 ENCOUNTER — Telehealth: Payer: Self-pay | Admitting: Family

## 2017-02-26 DIAGNOSIS — N289 Disorder of kidney and ureter, unspecified: Secondary | ICD-10-CM

## 2017-02-26 NOTE — Telephone Encounter (Signed)
Please let patient know that I reviewed his renal ultrasound. Renal ultrasound is normal. Since his kidney function is still showing some mild impairment I would like for him to meet with a kidney doctor for further evaluation. Most likely cause for this change is his history of hypertension. However I would like to rule out any other contributing causes.

## 2017-02-26 NOTE — Telephone Encounter (Signed)
Notified pt. He voices understanding and is agreeable to proceed with referral. 

## 2017-03-05 MED FILL — LEVOTHYROXINE 75 MCG TABLET: 75 | 30 days supply | Qty: 30 | Fill #5

## 2017-03-05 MED FILL — AMLODIPINE BESYLATE 10 MG T: 10 | 30 days supply | Qty: 30 | Fill #4

## 2017-03-05 MED FILL — HYDROCHLOROTHIAZIDE 25 MG T: 25 | 30 days supply | Qty: 30 | Fill #1

## 2017-03-05 MED FILL — LISINOPRIL 40 MG TABLET: 40 | 30 days supply | Qty: 30 | Fill #1

## 2017-03-05 MED FILL — raNITIdine HCL 150 MG TABS: 150 | 30 days supply | Qty: 60 | Fill #1

## 2017-04-07 DIAGNOSIS — I129 Hypertensive chronic kidney disease with stage 1 through stage 4 chronic kidney disease, or unspecified chronic kidney disease: Secondary | ICD-10-CM | POA: Diagnosis not present

## 2017-04-07 DIAGNOSIS — Z148 Genetic carrier of other disease: Secondary | ICD-10-CM | POA: Diagnosis not present

## 2017-04-07 DIAGNOSIS — N183 Chronic kidney disease, stage 3 (moderate): Secondary | ICD-10-CM | POA: Diagnosis not present

## 2017-04-08 ENCOUNTER — Other Ambulatory Visit: Payer: Self-pay | Admitting: Family

## 2017-04-08 MED FILL — HYDROCHLOROTHIAZIDE 25 MG T: 25 | 30 days supply | Qty: 30 | Fill #2

## 2017-04-08 MED FILL — AMLODIPINE BESYLATE 10 MG T: 10 | 30 days supply | Qty: 30 | Fill #5

## 2017-04-08 MED FILL — raNITIdine HCL 150 MG TABS: 150 | 30 days supply | Qty: 60 | Fill #2

## 2017-04-08 MED FILL — LISINOPRIL 40 MG TABLET: 40 | 30 days supply | Qty: 30 | Fill #2

## 2017-04-08 MED FILL — LEVOTHYROXINE 75 MCG TABLET: 75 | 30 days supply | Qty: 30 | Fill #0

## 2017-04-08 NOTE — Telephone Encounter (Signed)
Rx approved and sent to the pharmacy by e-script.//AB/CMA 

## 2017-04-12 DIAGNOSIS — N183 Chronic kidney disease, stage 3 (moderate): Secondary | ICD-10-CM | POA: Diagnosis not present

## 2017-04-26 ENCOUNTER — Encounter: Payer: Self-pay | Admitting: Family

## 2017-04-26 ENCOUNTER — Ambulatory Visit: Payer: BLUE CROSS/BLUE SHIELD | Admitting: Family

## 2017-04-26 VITALS — BP 138/84 | HR 74 | Temp 98.6°F | Resp 16 | Ht 66.0 in | Wt 180.0 lb

## 2017-04-26 DIAGNOSIS — D179 Benign lipomatous neoplasm, unspecified: Secondary | ICD-10-CM | POA: Diagnosis not present

## 2017-04-26 DIAGNOSIS — Z148 Genetic carrier of other disease: Secondary | ICD-10-CM

## 2017-04-26 DIAGNOSIS — K219 Gastro-esophageal reflux disease without esophagitis: Secondary | ICD-10-CM

## 2017-04-26 DIAGNOSIS — E039 Hypothyroidism, unspecified: Secondary | ICD-10-CM | POA: Diagnosis not present

## 2017-04-26 DIAGNOSIS — Z23 Encounter for immunization: Secondary | ICD-10-CM | POA: Diagnosis not present

## 2017-04-26 DIAGNOSIS — I10 Essential (primary) hypertension: Secondary | ICD-10-CM | POA: Diagnosis not present

## 2017-04-26 NOTE — Progress Notes (Signed)
Subjective:    Patient ID: Austin Boyd, male    DOB: 07/10/1965, 51 y.o.   MRN: [id number]  HPI   Mr. Perazzo is a 51 yr old male who presents today for follow up.   HTN- bp 138/84,  Continues amlodipine/HCTZ, lisinopril.   BP Readings from Last 3 Encounters:  04/26/17 138/84  01/18/17 (!) 151/83  09/09/16 128/79   Hypothyroid- maintained on synthroid 64mcg. Feels well on this dose.  Lab Results  Component Value Date   TSH 2.13 01/18/2017   GERD- Reports symptoms will controlled on GERD.   Hemochromatosis carrier-  Had normal ferritin level 3 months ago.  Most recent cbc was WNL.   Review of Systems    see HPI  Past Medical History:  Diagnosis Date  . Anxiety    scared of needles  . Chicken pox   . GERD (gastroesophageal reflux disease)   . 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 Hypertension   . PONV (postoperative nausea and vomiting)   . Thyroid disease   . Wears contact lenses      Social History   Socioeconomic History  . Marital status: Married    Spouse name: Not on file  . Number of children: Not on file  . Years of education: Not on file  . Highest education level: Not on file  Social Needs  . Financial resource strain: Not on file  . Food insecurity - worry: Not on file  . Food insecurity - inability: Not on file  . Transportation needs - medical: Not on file  . Transportation needs - non-medical: Not on file  Occupational History  . Not on file  Tobacco Use  . Smoking status: Never Smoker  . Smokeless tobacco: Never Used  Substance and Sexual Activity  . Alcohol use: Yes    Alcohol/week: 25.2 oz    Types: 42 Cans of beer per week  . Drug use: No  . Sexual activity: Not on file  Other Topics Concern  . 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 WISDOM TOOTH EXTRACTION      Family History  Problem Relation Age of Onset  . Arthritis Mother   . Alcohol abuse Father   . Multiple sclerosis Father        died in late 70's  . Cancer Brother 40       Thyroid   . Heart disease Maternal Grandmother   . Stroke Maternal Grandmother   . Hypertension Maternal Grandmother   . Heart disease Maternal Grandfather   . Stroke Maternal Grandfather   . Hypertension Maternal Grandfather   . Hyperlipidemia Paternal Grandmother   . Hyperlipidemia Paternal Grandfather   . Colon cancer Neg Hx     No Known Allergies  Current Outpatient Medications on File Prior to Visit  Medication Sig Dispense Refill  . amLODipine (NORVASC) 10 MG tablet TAKE 1 TABLET (10 MG TOTAL) BY MOUTH DAILY. 90 tablet 1  . hydrochlorothiazide (HYDRODIURIL) 25 MG tablet TAKE 1 TABLET (25 MG TOTAL) BY MOUTH DAILY. *NEED OFFICE VISIT* 90 tablet 1  . levothyroxine (SYNTHROID, LEVOTHROID) 75 MCG tablet TAKE 1 TABLET (75 MCG TOTAL) BY MOUTH  DAILY. 30 tablet 5  . lisinopril (PRINIVIL,ZESTRIL) 40 MG tablet TAKE 1 TABLET (40 MG TOTAL) BY MOUTH DAILY. *NEED OFFICE VISIT* 90 tablet 1  . ranitidine (ZANTAC) 150 MG tablet TAKE 1 TABLET (150 MG TOTAL) BY MOUTH 2 TIMES DAILY. 60 tablet 5   Current Facility-Administered Medications on File Prior to Visit  Medication Dose Route Frequency Provider Last Rate Last Dose  . 0.9 %  sodium chloride infusion  500 mL Intravenous Continuous Danis, Estill Cotta III, MD        BP 138/84 (BP Location: Right Arm, Cuff Size: Large)   Pulse 74   Temp 98.6 F (37 C) (Oral)   Resp 16   Ht 5\' 6"  (1.676 m)   Wt 180 lb (81.6 kg)   SpO2 99%   BMI 29.05 kg/m    Objective:   Physical Exam  Constitutional: He is oriented to person, place, and time. He appears well-developed and well-nourished. No distress.  HENT:  Head: Normocephalic and atraumatic.  Cardiovascular: Normal rate and  regular rhythm.  No murmur heard. Pulmonary/Chest: Effort normal and breath sounds normal. No respiratory distress. He has no wheezes. He has no rales.  Musculoskeletal: He exhibits no edema.  Neurological: He is alert and oriented to person, place, and time.  Skin: Skin is warm and dry.  Psychiatric: He has a normal mood and affect. His behavior is normal. Thought content normal.  MS:  Golf ball sized non-tender lipoma on left upper back        Assessment & Plan:  HTN-  Follow up BP is stable. Continue current meds.  Hemochromatosis carrier- ferritin/ H/H stable. Monitor.   Hypothyroid- obtain tsh.  Continue current dose of synthroid.   GERD- stable on current dose of zantac.   Lipoma- back, desires removal. (Had previous excision several years back) Refer to surgeon.   Flu shot today.

## 2017-04-26 NOTE — Patient Instructions (Signed)
You will be contacted about your referral to Dr. Molli Posey for the lipoma on your back.  Continue current meds.

## 2017-05-11 MED FILL — LEVOTHYROXINE 75 MCG TABLET: 75 | 30 days supply | Qty: 30 | Fill #1

## 2017-05-11 MED FILL — LISINOPRIL 40 MG TABLET: 40 | 30 days supply | Qty: 30 | Fill #3

## 2017-05-11 MED FILL — HYDROCHLOROTHIAZIDE 25 MG T: 25 | 30 days supply | Qty: 30 | Fill #3

## 2017-06-01 MED FILL — raNITIdine HCL 150 MG TABS: 150 | 30 days supply | Qty: 60 | Fill #3

## 2017-06-03 MED FILL — CEPHALEXIN 500 MG CAPSULE: 500 | 7 days supply | Qty: 21 | Fill #0

## 2017-06-03 MED FILL — IBUPROFEN 800 MG TAB: 800 | 7 days supply | Qty: 21 | Fill #0

## 2017-06-09 MED FILL — LISINOPRIL 40 MG TABS: 40 | 30 days supply | Qty: 30 | Fill #4

## 2017-06-09 MED FILL — HYDROCHLOROTHIAZIDE 25 MG T: 25 | 30 days supply | Qty: 30 | Fill #4

## 2017-06-09 MED FILL — LEVOTHYROXINE 75 MCG TABLET: 75 | 30 days supply | Qty: 30 | Fill #2

## 2017-07-02 MED FILL — raNITIdine HCL 150 MG TABS: 150 | 30 days supply | Qty: 60 | Fill #4

## 2017-07-05 DIAGNOSIS — M79645 Pain in left finger(s): Secondary | ICD-10-CM | POA: Insufficient documentation

## 2017-07-05 DIAGNOSIS — S6710XA Crushing injury of unspecified finger(s), initial encounter: Secondary | ICD-10-CM | POA: Insufficient documentation

## 2017-07-08 MED FILL — LEVOTHYROXINE 75 MCG TABLET: 75 | 30 days supply | Qty: 30 | Fill #3

## 2017-07-08 MED FILL — HYDROCHLOROTHIAZIDE 25 MG T: 25 | 30 days supply | Qty: 30 | Fill #5

## 2017-07-08 MED FILL — LISINOPRIL 40 MG TABS: 40 | 30 days supply | Qty: 30 | Fill #5

## 2017-07-22 DIAGNOSIS — M25642 Stiffness of left hand, not elsewhere classified: Secondary | ICD-10-CM | POA: Insufficient documentation

## 2017-08-05 ENCOUNTER — Other Ambulatory Visit: Payer: Self-pay | Admitting: Family

## 2017-08-05 MED FILL — raNITIdine HCL 150 MG TABS: 150 | 30 days supply | Qty: 60 | Fill #5

## 2017-08-05 MED FILL — LEVOTHYROXINE 75 MCG TABLET: 75 | 30 days supply | Qty: 30 | Fill #4

## 2017-08-06 MED FILL — LISINOPRIL 40 MG TABS: 40 | 30 days supply | Qty: 30 | Fill #0

## 2017-08-06 MED FILL — HYDROCHLOROTHIAZIDE 25 MG T: 25 | 30 days supply | Qty: 30 | Fill #0

## 2017-08-11 ENCOUNTER — Telehealth: Payer: Self-pay | Admitting: *Deleted

## 2017-08-11 MED ORDER — AMLODIPINE BESYLATE 10 MG PO TABS: 10.0000 mg | ORAL_TABLET | Freq: Every day | ORAL | 1 refills | Status: DC

## 2017-08-11 MED FILL — AMLODIPINE BESYLATE 10 MG T: 10 | 30 days supply | Qty: 30 | Fill #0

## 2017-08-11 NOTE — Telephone Encounter (Signed)
Received fax requesting amlodipine refill. Refill sent.

## 2017-08-23 ENCOUNTER — Ambulatory Visit: Payer: BLUE CROSS/BLUE SHIELD | Admitting: Family

## 2017-08-23 DIAGNOSIS — Z0289 Encounter for other administrative examinations: Secondary | ICD-10-CM

## 2017-08-30 ENCOUNTER — Encounter: Payer: Self-pay | Admitting: Family

## 2017-09-08 ENCOUNTER — Other Ambulatory Visit: Payer: Self-pay | Admitting: Family

## 2017-09-08 ENCOUNTER — Telehealth: Payer: Self-pay

## 2017-09-08 MED FILL — HYDROCHLOROTHIAZIDE 25 MG T: 25 | 30 days supply | Qty: 30 | Fill #1

## 2017-09-08 MED FILL — LEVOTHYROXINE 75 MCG TABLET: 75 | 30 days supply | Qty: 30 | Fill #5

## 2017-09-08 MED FILL — LISINOPRIL 40 MG TABS: 40 | 30 days supply | Qty: 30 | Fill #1

## 2017-09-08 NOTE — Telephone Encounter (Signed)
Please schedule transfer of care appt w/ Dr. Nani Ravens at [facility] convenience. Thank you.

## 2017-09-08 NOTE — Telephone Encounter (Signed)
Ok with me 

## 2017-09-08 NOTE — Telephone Encounter (Signed)
OK 

## 2017-09-08 NOTE — Telephone Encounter (Signed)
Please advise 

## 2017-09-08 NOTE — Telephone Encounter (Signed)
Pt has been sch with dr Nani Ravens on 09-17-17

## 2017-09-08 NOTE — Telephone Encounter (Signed)
Copied from [place] [id number]. Topic: Inquiry >> Sep 07, 2017  5:58 PM Austin Boyd B wrote: Reason for CRM: pt would like to transfer care to [facility], call pt to notify

## 2017-09-09 MED FILL — raNITIdine HCL 150 MG TABS: 150 | 30 days supply | Qty: 60 | Fill #0

## 2017-09-17 ENCOUNTER — Ambulatory Visit: Payer: BLUE CROSS/BLUE SHIELD | Admitting: Family Medicine

## 2017-09-17 ENCOUNTER — Encounter: Payer: Self-pay | Admitting: Family Medicine

## 2017-09-17 VITALS — BP 134/90 | HR 90 | Temp 98.2°F | Ht 66.0 in | Wt 187.5 lb

## 2017-09-17 DIAGNOSIS — I10 Essential (primary) hypertension: Secondary | ICD-10-CM

## 2017-09-17 MED ORDER — CHLORTHALIDONE 25 MG PO TABS: 12.5000 mg | ORAL_TABLET | Freq: Every day | ORAL | 2 refills | Status: DC

## 2017-09-17 MED ORDER — AMLODIPINE BESYLATE-VALSARTAN 5-320 MG PO TABS: 1.0000 | ORAL_TABLET | Freq: Every day | ORAL | 2 refills | Status: DC

## 2017-09-17 MED FILL — AMLODIPINE-VALSARTAN 5-320: 5-320 | 30 days supply | Qty: 30 | Fill #0

## 2017-09-17 MED FILL — CHLORTHALIDONE 25 MG TAB: 25 | 30 days supply | Qty: 15 | Fill #0

## 2017-09-17 NOTE — Progress Notes (Signed)
Pre visit review using our clinic review tool, if applicable. No additional management support is needed unless otherwise documented below in the visit note. 

## 2017-09-17 NOTE — Patient Instructions (Addendum)
You do not need to be fasting for next week's labs.   Keep your current blood pressure medicine around for now.  Keep the diet clean.  Stay active.  Around 3 times per week, check your blood pressure 4 times per day. Twice in the morning and twice in the evening. The readings should be at least one minute apart. Write down these values and bring them to your next nurse visit/appointment.  When you check your BP, make sure you have been doing something calm/relaxing 5 minutes prior to checking. Both feet should be flat on the floor and you should be sitting. Use your left arm and make sure it is in a relaxed position (on a table), and that the cuff is at the approximate level/height of your heart.   Healthy Eating Plan Many factors influence your heart health, including eating and exercise habits. Heart (coronary) risk increases with abnormal blood fat (lipid) levels. Heart-healthy meal planning includes limiting unhealthy fats, increasing healthy fats, and making other small dietary changes. This includes maintaining a healthy body weight to help keep lipid levels within a normal range.  WHAT IS MY PLAN?  Your health care provider recommends that you:  Drink a glass of water before meals to help with satiety.  Eat slowly.  An alternative to the water is to add Metamucil. This will help with satiety as well. It does contain calories, unlike water.  WHAT TYPES OF FAT SHOULD I CHOOSE?  Choose healthy fats more often. Choose monounsaturated and polyunsaturated fats, such as olive oil and canola oil, flaxseeds, walnuts, almonds, and seeds.  Eat more omega-3 fats. Good choices include salmon, mackerel, sardines, tuna, flaxseed oil, and ground flaxseeds. Aim to eat fish at least two times each week.  Avoid foods with partially hydrogenated oils in them. These contain trans fats. Examples of foods that contain trans fats are stick margarine, some tub margarines, cookies, crackers, and other  baked goods. If you are going to avoid a fat, this is the one to avoid!  WHAT GENERAL GUIDELINES DO I NEED TO FOLLOW?  Check food labels carefully to identify foods with trans fats. Avoid these types of options when possible.  Fill one half of your plate with vegetables and green salads. Eat 4-5 servings of vegetables per day. A serving of vegetables equals 1 cup of raw leafy vegetables,  cup of raw or cooked cut-up vegetables, or  cup of vegetable juice.  Fill one fourth of your plate with whole grains. Look for the word "whole" as the first word in the ingredient list.  Fill one fourth of your plate with lean protein foods.  Eat 4-5 servings of fruit per day. A serving of fruit equals one medium whole fruit,  cup of dried fruit,  cup of fresh, frozen, or canned fruit. Try to avoid fruits in cups/syrups as the sugar content can be high.  Eat more foods that contain soluble fiber. Examples of foods that contain this type of fiber are apples, broccoli, carrots, beans, peas, and barley. Aim to get 20-30 g of fiber per day.  Eat more home-cooked food and less restaurant, buffet, and fast food.  Limit or avoid alcohol.  Limit foods that are high in starch and sugar.  Avoid fried foods when able.  Cook foods by using methods other than frying. Baking, boiling, grilling, and broiling are all great options. Other fat-reducing suggestions include: ? Removing the skin from poultry. ? Removing all visible fats from meats. ? Skimming  the fat off of stews, soups, and gravies before serving them. ? Steaming vegetables in water or broth.  Lose weight if you are overweight. Losing just 5-10% of your initial body weight can help your overall health and prevent diseases such as diabetes and heart disease.  Increase your consumption of nuts, legumes, and seeds to 4-5 servings per week. One serving of dried beans or legumes equals  cup after being cooked, one serving of nuts equals 1 ounces, and  one serving of seeds equals  ounce or 1 tablespoon.  WHAT ARE GOOD FOODS CAN I EAT? Grains Grainy breads (try to find bread that is 3 g of fiber per slice or greater), oatmeal, light popcorn. Whole-grain cereals. Rice and pasta, including brown rice and those that are made with whole wheat. Edamame pasta is a great alternative to grain pasta. It has a higher protein content. Try to avoid significant consumption of white bread, sugary cereals, or pastries/baked goods.  Vegetables All vegetables. Cooked white potatoes do not count as vegetables.  Fruits All fruits, but limit pineapple and bananas as these fruits have a higher sugar content.  Meats and Other Protein Sources Lean, well-trimmed beef, veal, pork, and lamb. Chicken and [place] without skin. All fish and shellfish. Wild duck, rabbit, pheasant, and venison. Egg whites or low-cholesterol egg substitutes. Dried beans, peas, lentils, and tofu.Seeds and most nuts.  Dairy Low-fat or nonfat cheeses, including ricotta, string, and mozzarella. Skim or 1% milk that is liquid, powdered, or evaporated. Buttermilk that is made with low-fat milk. Nonfat or low-fat yogurt. Soy/Almond milk are good alternatives if you cannot handle dairy.  Beverages Water is the best for you. Sports drinks with less sugar are more desirable unless you are a highly active athlete.  Sweets and Desserts Sherbets and fruit ices. Honey, jam, marmalade, jelly, and syrups. Dark chocolate.  Eat all sweets and desserts in moderation.  Fats and Oils Nonhydrogenated (trans-free) margarines. Vegetable oils, including soybean, sesame, sunflower, olive, peanut, safflower, corn, canola, and cottonseed. Salad dressings or mayonnaise that are made with a vegetable oil. Limit added fats and oils that you use for cooking, baking, salads, and as spreads.  Other Cocoa powder. Coffee and tea. Most condiments.  The items listed above may not be a complete list of recommended foods  or beverages. Contact your dietitian for more options.

## 2017-09-17 NOTE — Progress Notes (Signed)
Chief Complaint  Patient presents with  . Establish Care    Subjective Austin Boyd is a 52 y.o. male who presents for hypertension follow up. He does not monitor home blood pressures. He is compliant with medication- HCTZ 25 mg/d, lisinopril 40 mg/d, amlodipine 5 mg/d. Patient has these side effects of medication: none He is sometimes adhering to a healthy diet overall. Current exercise: lifting weights, some cardio   Past Medical History:  Diagnosis Date  . Anxiety    scared of needles  . Chicken pox   . GERD (gastroesophageal reflux disease)   . 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 Hypertension   . PONV (postoperative nausea and vomiting)   . Thyroid disease   . Wears contact lenses    Review of Systems Cardiovascular: no chest pain Respiratory:  no shortness of breath  Exam BP 134/90 (BP Location: Left Arm, Patient Position: Sitting, Cuff Size: Large)   Pulse 90   Temp 98.2 F (36.8 C) (Oral)   Ht 5\' 6"  (1.676 m)   Wt 187 lb 8 oz (85 kg)   SpO2 97%   BMI 30.26 kg/m  General:  well developed, well nourished, in no apparent distress Skin: warm, no pallor or diaphoresis Eyes: pupils equal and round, sclera anicteric without injection Heart: RRR, no bruits, no LE edema Lungs: clear to auscultation, no accessory muscle use Psych: well oriented with normal range of affect and appropriate judgment/insight  Essential hypertension - Plan: amLODipine-valsartan (EXFORGE) 5-320 MG tablet, chlorthalidone (HYGROTON) 25 MG tablet, Basic metabolic panel  Orders as above. I am going to d/c ACEi, HCTZ and amlodipine. Will change ACEi to ARB and combine with amlodipine and change HCTZ to chlorthalidone 12.5 mg/d, check labs in 1 week and then increase to 25 mg/d.  Counseled on diet and exercise, healthy diet handout given. F/u in 1 week for labs, 1 mo with me. The patient voiced understanding and agreement to the plan.  [place], DO 09/17/17  4:11 PM

## 2017-09-23 ENCOUNTER — Other Ambulatory Visit (INDEPENDENT_AMBULATORY_CARE_PROVIDER_SITE_OTHER): Payer: BLUE CROSS/BLUE SHIELD

## 2017-09-23 DIAGNOSIS — I10 Essential (primary) hypertension: Secondary | ICD-10-CM | POA: Diagnosis not present

## 2017-09-24 LAB — BASIC METABOLIC PANEL
BUN: 23 mg/dL (ref 6–23)
CALCIUM: 9.1 mg/dL (ref 8.4–10.5)
CO2: 29 meq/L (ref 19–32)
Chloride: 99 mEq/L (ref 96–112)
Creatinine, Ser: 1.58 mg/dL — ABNORMAL HIGH (ref 0.40–1.50)
GFR: 49.23 mL/min — ABNORMAL LOW (ref >60.00)
Glucose, Bld: 96 mg/dL (ref 70–99)
POTASSIUM: 3.7 meq/L (ref 3.5–5.1)
Sodium: 138 mEq/L (ref 135–145)

## 2017-10-01 ENCOUNTER — Encounter: Payer: Self-pay | Admitting: Family Medicine

## 2017-10-01 DIAGNOSIS — I10 Essential (primary) hypertension: Secondary | ICD-10-CM

## 2017-10-04 MED ORDER — CHLORTHALIDONE 25 MG PO TABS: 25.0000 mg | ORAL_TABLET | Freq: Every day | ORAL | 2 refills | Status: DC

## 2017-10-04 MED FILL — CHLORTHALIDONE 25 MG TAB: 25 | 30 days supply | Qty: 30 | Fill #0

## 2017-10-08 ENCOUNTER — Other Ambulatory Visit: Payer: Self-pay | Admitting: Family

## 2017-10-08 MED FILL — raNITIdine HCL 150 MG TABS: 150 | 30 days supply | Qty: 60 | Fill #1

## 2017-10-08 MED FILL — LEVOTHYROXINE 75 MCG TABLET: 75 | 30 days supply | Qty: 30 | Fill #0

## 2017-10-08 NOTE — Telephone Encounter (Signed)
Pt now seeing Dr Nani Ravens.

## 2017-10-15 MED FILL — AMLODIPINE-VALSARTAN 5-320: 5-320 | 30 days supply | Qty: 30 | Fill #1

## 2017-10-18 ENCOUNTER — Ambulatory Visit: Payer: BLUE CROSS/BLUE SHIELD | Admitting: Family Medicine

## 2017-10-18 ENCOUNTER — Encounter: Payer: Self-pay | Admitting: Family Medicine

## 2017-10-18 VITALS — BP 140/92 | HR 87 | Temp 99.1°F | Ht 66.0 in | Wt 189.1 lb

## 2017-10-18 DIAGNOSIS — M7061 Trochanteric bursitis, right hip: Secondary | ICD-10-CM | POA: Diagnosis not present

## 2017-10-18 DIAGNOSIS — I10 Essential (primary) hypertension: Secondary | ICD-10-CM

## 2017-10-18 NOTE — Progress Notes (Signed)
Pre visit review using our clinic review tool, if applicable. No additional management support is needed unless otherwise documented below in the visit note. 

## 2017-10-18 NOTE — Patient Instructions (Addendum)
Continue checking your blood pressures at home.  Clean up the diet.  Keep the blood pressure meds the same.  Gluteus Medius Syndrome Rehab It is normal to feel mild stretching, pulling, tightness, or discomfort as you do these exercises, but you should stop right away if you feel sudden pain or your pain gets worse.   Stretching and range of motion exercise This exercise warms up your muscles and joints and improves the movement and flexibility of your hip and pelvis. This exercise also helps to relieve pain and stiffness. Exercise A: Lunge (hip flexor stretch)     1. Kneel on the floor on your left / right knee. Bend your other knee so it is directly over your ankle. 2. Keep good posture with your head over your shoulders. Tuck your tailbone underneath you. This will prevent your back from arching too much. 3. You should feel a gentle stretch in the front of your thigh or hip. If you do not feel a stretch, slowly lunge forward with your chest up. 4. Hold this position for 30 seconds. 5. Slowly return to the starting position. Repeat 2 times. Complete this exercise 3 times per week. Strengthening exercises These exercises build strength and endurance in your hip and pelvis. Endurance is the ability to use your muscles for a long time, even after they get tired. Exercise B: Bridge (hip extensors)    1. Lie on your back on a firm surface with your knees bent and your feet flat on the floor. 2. Tighten your buttocks muscles and lift your bottom off the floor until the trunk of your body is level with your thighs. ? You should feel the muscles working in your buttocks and the back of your thighs. If this exercise is too easy, cross your arms over your chest or lift one leg while your bottom is up off the floor. ? Do not arch your back. 3. Hold this position for 3 seconds. 4. Slowly lower your hips to the starting position. 5. Let your muscles relax completely between repetitions. Repeat  2 times. Complete this exercise 3 times per week. Exercise C: Straight leg raises (hip abductors)    1. Lie on your side with your left / right leg in the top position. Lie so your head, shoulder, knee, and hip line up. Bend your bottom knee to help you balance. 2. Lift your top leg up 4-6 inches (10-15 cm), keeping your toes pointed straight ahead. 3. Hold this position for 2 seconds. 4. Slowly lower your leg to the starting position and let your muscles relax completely. Repeat for a total of 10 repetitions. Repeat 2 times. Complete this exercise 3 times per week. Exercise D: Hip abductors and external rotators, quadruped 1. Get on your hands and knees on a firm, lightly padded surface. Your hands should be directly below your shoulders, and your knees should be directly below your hips. 2. Lift your left / right knee out to the side. Keep your knee bent. Do not twist your body. 3. Hold this position for 3 seconds. 4. Slowly lower your leg. Repeat for a total of 10 repetitions.  Repeat 2 times. Complete this exercise 3 times per week. Exercise E: Single leg stand 1. Stand near a counter or door frame to hold onto as needed. It is helpful to look in a mirror for this exercise so you can watch your hip. 2. Squeeze your left / right buttock muscles then lift up your other foot. Do  not let your left / righthip push out to the side. 3. Hold this position for 3 seconds. Repeat for a total of 10 repetitions. Repeat 2 times. Complete this exercise 3 times per week. Make sure you discuss any questions you have with your health care provider. Document Released: 06/01/2005 Document Revised: 02/06/2016 Document Reviewed: 05/14/2015 Elsevier Interactive Patient Education  [employer].

## 2017-10-18 NOTE — Progress Notes (Signed)
Chief Complaint  Patient presents with  . Hypertension    Subjective Austin Boyd is a 52 y.o. male who presents for hypertension follow up. He does monitor home blood pressures. Blood pressures ranging from 130's/60-70's on average. He is compliant with medication- chlorthalidone 25 mg/d, Exforge 5-320 mg/d Patient has these side effects of medication: none He is adhering to a healthy diet overall. Current exercise: lifting  R hip pain for years. 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 Thinks this could be contributing.   Past Medical History:  Diagnosis Date  . Anxiety    scared of needles  . Chicken pox   . GERD (gastroesophageal reflux disease)   . 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 Hypertension   . PONV (postoperative nausea and vomiting)   . Thyroid disease   . Wears contact lenses     Review of Systems Cardiovascular: no chest pain Respiratory:  no shortness of breath  Exam BP (!) 140/92 (BP Location: Left Arm, Patient Position: Sitting, Cuff Size: Large)   Pulse 87   Temp 99.1 F (37.3 C) (Oral)   Ht 5\' 6"  (1.676 m)   Wt 189 lb 2 oz (85.8 kg)   SpO2 94%   BMI 30.53 kg/m  General:  well developed, well nourished, in no apparent distress Heart: RRR, no bruits, no LE edema Lungs: clear to auscultation, no accessory muscle use MSK: +TTP over R greater troch, neg log roll, Stinchfield, FADDIR, FABER, Obers on R Psych: well oriented with normal range of affect and appropriate judgment/insight  Essential hypertension - Plan: Basic metabolic panel  Greater trochanteric bursitis of right hip  Orders as above. Cont current meds. Stretches/exercises for hip, will also try to stretch IT band. I personally showed him a stretch to do at home. Counseled on diet and exercise. F/u in 2 mo to recheck. The patient voiced understanding and agreement to the plan.  [place], DO 10/18/17  4:22 PM

## 2017-11-05 ENCOUNTER — Other Ambulatory Visit: Payer: Self-pay | Admitting: Family

## 2017-11-05 MED FILL — CHLORTHALIDONE 25 MG TAB: 25 | 30 days supply | Qty: 30 | Fill #1

## 2017-11-05 MED FILL — raNITIdine HCL 150 MG TABS: 150 | 30 days supply | Qty: 60 | Fill #0

## 2017-11-05 MED FILL — LEVOTHYROXINE 75 MCG TABLET: 75 | 30 days supply | Qty: 30 | Fill #1

## 2017-11-17 MED FILL — AMLODIPINE-VALSARTAN 5-320: 5-320 | 30 days supply | Qty: 30 | Fill #2

## 2017-12-06 MED FILL — LEVOTHYROXINE 75 MCG TABLET: 75 | 30 days supply | Qty: 30 | Fill #2

## 2017-12-06 MED FILL — CHLORTHALIDONE 25 MG TAB: 25 | 30 days supply | Qty: 30 | Fill #2

## 2017-12-06 MED FILL — raNITIdine HCL 150 MG TABS: 150 | 30 days supply | Qty: 60 | Fill #1

## 2017-12-17 ENCOUNTER — Other Ambulatory Visit: Payer: Self-pay | Admitting: Family Medicine

## 2017-12-17 DIAGNOSIS — I10 Essential (primary) hypertension: Secondary | ICD-10-CM

## 2017-12-17 MED FILL — AMLODIPINE-VALSARTAN 5-320: 5-320 | 30 days supply | Qty: 30 | Fill #0

## 2017-12-20 ENCOUNTER — Encounter: Payer: Self-pay | Admitting: Family Medicine

## 2017-12-20 ENCOUNTER — Ambulatory Visit: Payer: BLUE CROSS/BLUE SHIELD | Admitting: Family Medicine

## 2017-12-20 VITALS — BP 144/94 | HR 83 | Temp 98.4°F | Resp 18 | Wt 193.4 lb

## 2017-12-20 DIAGNOSIS — I10 Essential (primary) hypertension: Secondary | ICD-10-CM | POA: Diagnosis not present

## 2017-12-20 MED ORDER — SPIRONOLACTONE 25 MG PO TABS: 25.0000 mg | ORAL_TABLET | Freq: Every day | ORAL | 2 refills | Status: DC

## 2017-12-20 MED ORDER — RANITIDINE HCL 150 MG PO TABS: ORAL_TABLET | ORAL | 5 refills | Status: DC

## 2017-12-20 MED FILL — SPIRONOLACTONE 25 MG TABLET: 25 | 30 days supply | Qty: 30 | Fill #0

## 2017-12-20 NOTE — Patient Instructions (Addendum)
Try to clean up the diet.  Keep active.  Keep checking your blood pressure at home.  Bring your blood pressure monitor to your next visit.  Let us know if you need anything.

## 2017-12-20 NOTE — Progress Notes (Signed)
Chief Complaint  Patient presents with  . Hypertension    Subjective Austin Boyd is a 52 y.o. male who presents for hypertension follow up. He does monitor home blood pressures. Blood pressures ranging from 140's/70-80's on average. He is compliant with medications- . Patient has these side effects of medication: none He is sometimes adhering to a healthy diet overall. Current exercise: physically active at work  His hip is doing better.   Past Medical History:  Diagnosis Date  . Anxiety    scared of needles  . Chicken pox   . GERD (gastroesophageal reflux disease)   . 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 Hypertension   . PONV (postoperative nausea and vomiting)   . Thyroid disease   . Wears contact lenses     Review of Systems Cardiovascular: no chest pain Respiratory:  no shortness of breath  Exam BP (!) 144/94 (BP Location: Left Arm, Patient Position: Sitting, Cuff Size: Large)   Pulse 83   Temp 98.4 F (36.9 C) (Oral)   Resp 18   Wt 193 lb 6.4 oz (87.7 kg)   SpO2 97%   BMI 31.22 kg/m  General:  well developed, well nourished, in no apparent distress Heart: RRR, no bruits, no LE edema Lungs: clear to auscultation, no accessory muscle use Psych: well oriented with normal range of affect and appropriate judgment/insight  Essential hypertension - Plan: spironolactone (ALDACTONE) 25 MG tablet, Basic metabolic panel, TSH  Orders as above. Add Aldactone. Ck BMP today and in 1 week. Counseled on diet and exercise. He is doing well with exercise, room for improvement with diet.  Hip is doing better. F/u in 1 mo. If no improvement, will have to investigate secondary causes of HTN. The patient voiced understanding and agreement to the plan.  [place], DO 12/20/17  4:38 PM

## 2017-12-21 LAB — BASIC METABOLIC PANEL
BUN: 20 mg/dL (ref 6–23)
CO2: 33 mEq/L — ABNORMAL HIGH (ref 19–32)
Calcium: 10.3 mg/dL (ref 8.4–10.5)
Chloride: 99 mEq/L (ref 96–112)
Creatinine, Ser: 1.57 mg/dL — ABNORMAL HIGH (ref 0.40–1.50)
GFR: 49.55 mL/min — AB (ref >60.00)
GLUCOSE: 97 mg/dL (ref 70–99)
Potassium: 4.7 mEq/L (ref 3.5–5.1)
Sodium: 140 mEq/L (ref 135–145)

## 2017-12-21 LAB — TSH: TSH: 2.42 u[IU]/mL (ref 0.35–4.50)

## 2017-12-28 ENCOUNTER — Ambulatory Visit (INDEPENDENT_AMBULATORY_CARE_PROVIDER_SITE_OTHER): Payer: BLUE CROSS/BLUE SHIELD | Admitting: Internal Medicine

## 2017-12-28 ENCOUNTER — Other Ambulatory Visit (INDEPENDENT_AMBULATORY_CARE_PROVIDER_SITE_OTHER): Payer: BLUE CROSS/BLUE SHIELD

## 2017-12-28 DIAGNOSIS — I10 Essential (primary) hypertension: Secondary | ICD-10-CM

## 2017-12-28 NOTE — Addendum Note (Signed)
Addended by: Kelle Darting A on: 12/28/2017 01:06 PM   Modules accepted: Orders

## 2017-12-28 NOTE — Progress Notes (Signed)
Pt here for Blood pressure check per Dr Nani Ravens.  Pt currently takes:  Chlorthalidone 25mg  once a day. Exforge 5/320mg  once a day Spironolactone 25mg  once daily was added last visit.  BP Readings from Last 3 Encounters:  12/20/17 (!) 144/94  10/18/17 (!) 140/92  09/17/17 134/90   Pt reports compliance with medications and denies any new symptoms / side effects.  BP today @ 4:13 = 148/92 Repeat BP @ 4:27 = 151/90 HR = 71  Pt has follow up with PCP on 01/03/18.  Pt advised per Dr Larose Kells in PCP's absence to continue current medications and await further recommendations from PCP tomorrow.   agree Kathlene November, MD

## 2017-12-29 LAB — BASIC METABOLIC PANEL
BUN: 26 mg/dL — ABNORMAL HIGH (ref 6–23)
CO2: 31 mEq/L (ref 19–32)
Calcium: 9.7 mg/dL (ref 8.4–10.5)
Chloride: 99 mEq/L (ref 96–112)
Creatinine, Ser: 1.59 mg/dL — ABNORMAL HIGH (ref 0.40–1.50)
GFR: 48.83 mL/min — ABNORMAL LOW (ref >60.00)
GLUCOSE: 92 mg/dL (ref 70–99)
POTASSIUM: 4.3 meq/L (ref 3.5–5.1)
SODIUM: 138 meq/L (ref 135–145)

## 2018-01-03 ENCOUNTER — Telehealth: Payer: Self-pay

## 2018-01-03 ENCOUNTER — Ambulatory Visit (INDEPENDENT_AMBULATORY_CARE_PROVIDER_SITE_OTHER): Payer: BLUE CROSS/BLUE SHIELD | Admitting: Family Medicine

## 2018-01-03 ENCOUNTER — Encounter: Payer: Self-pay | Admitting: Family Medicine

## 2018-01-03 VITALS — BP 120/80 | HR 76 | Temp 98.4°F | Ht 67.0 in | Wt 187.5 lb

## 2018-01-03 DIAGNOSIS — Z0000 Encounter for general adult medical examination without abnormal findings: Secondary | ICD-10-CM | POA: Diagnosis not present

## 2018-01-03 DIAGNOSIS — Z114 Encounter for screening for human immunodeficiency virus [HIV]: Secondary | ICD-10-CM | POA: Diagnosis not present

## 2018-01-03 LAB — LIPID PANEL
CHOLESTEROL: 179 mg/dL (ref 0–200)
HDL: 59.1 mg/dL (ref >39.00)
LDL Cholesterol: 103 mg/dL — ABNORMAL HIGH (ref 0–99)
NonHDL: 119.96
TRIGLYCERIDES: 87 mg/dL (ref 0.0–149.0)
Total CHOL/HDL Ratio: 3
VLDL: 17.4 mg/dL (ref 0.0–40.0)

## 2018-01-03 LAB — COMPREHENSIVE METABOLIC PANEL
ALBUMIN: 4.5 g/dL (ref 3.5–5.2)
ALT: 56 U/L — AB (ref 0–53)
AST: 33 U/L (ref 0–37)
Alkaline Phosphatase: 47 U/L (ref 39–117)
BILIRUBIN TOTAL: 0.9 mg/dL (ref 0.2–1.2)
BUN: 17 mg/dL (ref 6–23)
CALCIUM: 9.7 mg/dL (ref 8.4–10.5)
CHLORIDE: 98 meq/L (ref 96–112)
CO2: 31 mEq/L (ref 19–32)
CREATININE: 1.47 mg/dL (ref 0.40–1.50)
GFR: 53.45 mL/min — ABNORMAL LOW (ref >60.00)
Glucose, Bld: 114 mg/dL — ABNORMAL HIGH (ref 70–99)
Potassium: 4.1 mEq/L (ref 3.5–5.1)
Sodium: 137 mEq/L (ref 135–145)
TOTAL PROTEIN: 7.4 g/dL (ref 6.0–8.3)

## 2018-01-03 MED ORDER — SILDENAFIL CITRATE 100 MG PO TABS: 100.0000 mg | ORAL_TABLET | Freq: Every day | ORAL | 0 refills | Status: DC | PRN

## 2018-01-03 MED FILL — SILDENAFIL CITRATE 100 MG T: 100 | 30 days supply | Qty: 6 | Fill #0

## 2018-01-03 NOTE — Telephone Encounter (Signed)
PA initiated via Covermymeds; KEY: [id number]. Received real-time PA approval.   CaseId:[id number];Status:Approved;Review Type:Prior Auth;Coverage Start Date:12/04/2017;Coverage End Date:01/03/2019;

## 2018-01-03 NOTE — Progress Notes (Signed)
Chief Complaint  Patient presents with  . Annual Exam    Well Male Austin Boyd is here for a complete physical.   His last physical was >1 year ago.  Current diet: in general, a "healthy" diet.  Current exercise: cardio, lifting Weight trend: stable Does pt snore? Sometimes, no apneic episodes  Daytime fatigue? No. Seat belt? Yes.    Health maintenance Shingrix- No Colonoscopy- Yes Tetanus- Yes HIV- No Prostate cancer screening- Yes   Past Medical History:  Diagnosis Date  . Anxiety    scared of needles  . Chicken pox   . GERD (gastroesophageal reflux disease)   . 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 Hypertension   . PONV (postoperative nausea and vomiting)   . Thyroid disease   . Wears contact lenses       Past Surgical History:  Procedure Laterality Date  . INSERTION OF MESH N/A 06/13/2014   Procedure: INSERTION OF MESH;  Surgeon: Donnie Mesa, MD;  Location: [facility];  Service: General;  Laterality: N/A;  . UMBILICAL HERNIA REPAIR N/A 06/13/2014   Procedure: HERNIA REPAIR UMBILICAL ADULT;  Surgeon: Donnie Mesa, MD;  Location: [facility];  Service: General;  Laterality: N/A;  . WISDOM TOOTH EXTRACTION      Medications  Current Outpatient Medications on File Prior to Visit  Medication Sig Dispense Refill  . amLODipine-valsartan (EXFORGE) 5-320 MG tablet TAKE 1 TABLET BY MOUTH DAILY. 30 tablet 2  . chlorthalidone (HYGROTON) 25 MG tablet Take 1 tablet (25 mg total) by mouth daily. 90 tablet 2  . levothyroxine (SYNTHROID, LEVOTHROID) 75 MCG tablet TAKE 1 TABLET (75 MCG TOTAL) BY MOUTH DAILY. 30 tablet 5  . ranitidine (ZANTAC) 150 MG tablet TAKE 1 TABLET BY MOUTH TWICE DAILY 60 tablet 5  . spironolactone (ALDACTONE) 25 MG tablet Take 1 tablet (25 mg total) by mouth daily. 30 tablet 2    Allergies No Known Allergies  Family History Family History  Problem Relation Age of Onset  .  Arthritis Mother   . Alcohol abuse Father   . Multiple sclerosis Father        died in late 67's  . Cancer Brother 40       Thyroid   . Heart disease Maternal Grandmother   . Stroke Maternal Grandmother   . Hypertension Maternal Grandmother   . Heart disease Maternal Grandfather   . Stroke Maternal Grandfather   . Hypertension Maternal Grandfather   . Hyperlipidemia Paternal Grandmother   . Hyperlipidemia Paternal Grandfather   . Colon cancer Neg Hx     Review of Systems: Constitutional:  no fevers Eye:  no recent significant change in vision Ear/Nose/Mouth/Throat:  Ears:  no hearing loss Nose/Mouth/Throat:  no complaints of nasal congestion, no sore throat Cardiovascular:  no chest pain, no palpitations Respiratory:  no cough and no shortness of breath Gastrointestinal:  no abdominal pain, no change in bowel habits GU:  Male: negative for dysuria, frequency, and incontinence and negative for prostate symptoms; +ED Musculoskeletal/Extremities:  no pain, redness, or swelling of the joints Integumentary (Skin/Breast):  no abnormal skin lesions reported Neurologic:  no headaches Endocrine: No unexpected weight changes Hematologic/Lymphatic:  no abnormal bleeding  Exam BP 120/80 (BP Location: Left Arm, Patient Position: Sitting, Cuff Size: Normal)   Pulse 76   Temp 98.4 F (36.9 C) (Oral)   Ht 5\' 7"  (1.702 m)   Wt 187 lb 8 oz (85 kg)   SpO2  99%   BMI 29.37 kg/m  General:  well developed, well nourished, in no apparent distress Skin:  no significant moles, warts, or growths Head:  no masses, lesions, or tenderness Eyes:  pupils equal and round, sclera anicteric without injection Ears:  canals without lesions, TMs shiny without retraction, no obvious effusion, no erythema Nose:  nares patent, septum midline, mucosa normal Throat/Pharynx:  lips and gingiva without lesion; tongue and uvula midline; non-inflamed pharynx; no exudates or postnasal drainage Neck: neck supple  without adenopathy, thyromegaly, or masses Lungs:  clear to auscultation, breath sounds equal bilaterally, no respiratory distress Cardio:  regular rate and rhythm, no LE edema, no bruits Abdomen:  abdomen soft, nontender; bowel sounds normal; no masses or organomegaly Genital (male): circumcised penis, no lesions or discharge; testes present bilaterally without masses or tenderness Rectal: Deferred Musculoskeletal:  symmetrical muscle groups noted without atrophy or deformity Extremities:  no clubbing, cyanosis, or edema, no deformities, no skin discoloration Neuro:  gait normal; deep tendon reflexes normal and symmetric Psych: well oriented with normal range of affect and appropriate judgment/insight  Assessment and Plan  Well adult exam - Plan: Comprehensive metabolic panel, Lipid panel  Screening for HIV (human immunodeficiency virus) - Plan: HIV antibody   Well 52 y.o. male. Counseled on diet and exercise. Counseled on risks and benefits of prostate cancer screening with PSA. The patient agrees to forego testing. Immunizations, labs, and further orders as above. Start Viagra. Follow up in 3 mo to ck BP. The patient voiced understanding and agreement to the plan.  [place], DO 01/03/18 9:07 AM

## 2018-01-03 NOTE — Progress Notes (Signed)
Pre visit review using our clinic review tool, if applicable. No additional management support is needed unless otherwise documented below in the visit note. 

## 2018-01-03 NOTE — Patient Instructions (Signed)
Keep the diet clean and stay active.  Blood pressure monitor options include Omron (upper arm) for a reliable way to check BP.  Artificial tears like Refresh and Systane may be used for comfort. OK to get generic version. Generally people use them every 2-4 hours, but you can use them as much as you want because there is no medication in it.  1-2 days to get the results of your labs back.   Let us know if you need anything.

## 2018-01-04 LAB — HIV ANTIBODY (ROUTINE TESTING W REFLEX): HIV: NONREACTIVE

## 2018-01-05 ENCOUNTER — Other Ambulatory Visit: Payer: Self-pay | Admitting: Family Medicine

## 2018-01-05 DIAGNOSIS — R739 Hyperglycemia, unspecified: Secondary | ICD-10-CM

## 2018-01-05 DIAGNOSIS — R7401 Elevation of levels of liver transaminase levels: Secondary | ICD-10-CM

## 2018-01-05 DIAGNOSIS — R740 Nonspecific elevation of levels of transaminase and lactic acid dehydrogenase [LDH]: Secondary | ICD-10-CM

## 2018-01-07 MED FILL — raNITIdine HCL 150 MG TABS: 150 | 30 days supply | Qty: 60 | Fill #2

## 2018-01-07 MED FILL — CHLORTHALIDONE 25 MG TABS: 25 | 30 days supply | Qty: 30 | Fill #3

## 2018-01-07 MED FILL — LEVOTHYROXINE 75 MCG TABLET: 75 | 30 days supply | Qty: 30 | Fill #3

## 2018-01-10 MED FILL — AMLODIPINE-VALSARTAN 5-320: 5-320 | 30 days supply | Qty: 30 | Fill #1

## 2018-01-11 MED FILL — SPIRONOLACTONE 25 MG TABLET: 25 | 30 days supply | Qty: 30 | Fill #1

## 2018-01-12 ENCOUNTER — Other Ambulatory Visit (INDEPENDENT_AMBULATORY_CARE_PROVIDER_SITE_OTHER): Payer: BLUE CROSS/BLUE SHIELD

## 2018-01-12 DIAGNOSIS — R740 Nonspecific elevation of levels of transaminase and lactic acid dehydrogenase [LDH]: Secondary | ICD-10-CM

## 2018-01-12 DIAGNOSIS — R7401 Elevation of levels of liver transaminase levels: Secondary | ICD-10-CM

## 2018-01-12 DIAGNOSIS — R739 Hyperglycemia, unspecified: Secondary | ICD-10-CM | POA: Diagnosis not present

## 2018-01-13 LAB — HEPATIC FUNCTION PANEL
ALBUMIN: 4.5 g/dL (ref 3.5–5.2)
ALT: 56 U/L — AB (ref 0–53)
AST: 39 U/L — AB (ref 0–37)
Alkaline Phosphatase: 47 U/L (ref 39–117)
Bilirubin, Direct: 0.2 mg/dL (ref 0.0–0.3)
TOTAL PROTEIN: 7 g/dL (ref 6.0–8.3)
Total Bilirubin: 0.8 mg/dL (ref 0.2–1.2)

## 2018-01-13 LAB — CK: Total CK: 681 U/L — ABNORMAL HIGH (ref 7–232)

## 2018-01-13 LAB — HEMOGLOBIN A1C: Hgb A1c MFr Bld: 5.7 % (ref 4.6–6.5)

## 2018-01-14 ENCOUNTER — Encounter: Payer: Self-pay | Admitting: Family Medicine

## 2018-01-14 DIAGNOSIS — K760 Fatty (change of) liver, not elsewhere classified: Secondary | ICD-10-CM | POA: Insufficient documentation

## 2018-02-07 MED FILL — SPIRONOLACTONE 25 MG TABLET: 25 | 30 days supply | Qty: 30 | Fill #2

## 2018-02-07 MED FILL — LEVOTHYROXINE 75 MCG TABLET: 75 | 30 days supply | Qty: 30 | Fill #4

## 2018-02-07 MED FILL — CHLORTHALIDONE 25 MG TABS: 25 | 30 days supply | Qty: 30 | Fill #4

## 2018-02-16 MED FILL — AMLODIPINE-VALSARTAN 5-320: 5-320 | 30 days supply | Qty: 30 | Fill #2

## 2018-02-16 MED FILL — raNITIdine HCL 150 MG TABS: 150 | 30 days supply | Qty: 60 | Fill #3

## 2018-03-10 IMAGING — US US RENAL
1 series · 14 of 24 positions shown · non-contrast
Comparison: Abdominal ultrasound October 31, 2014

CLINICAL DATA: Renal insufficiency

EXAM:
RENAL / URINARY TRACT ULTRASOUND COMPLETE

[Series 1: us renal · 0.25mm/px · 14 of 24 slices shown]
[im 1/24]
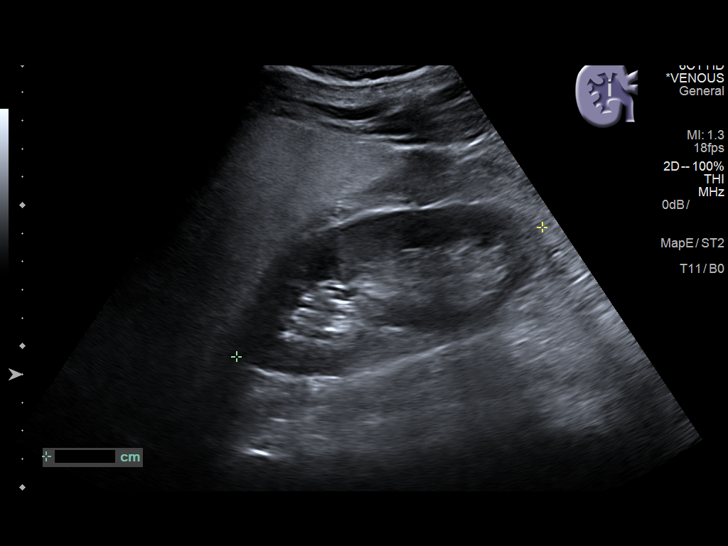
[im 3/24]
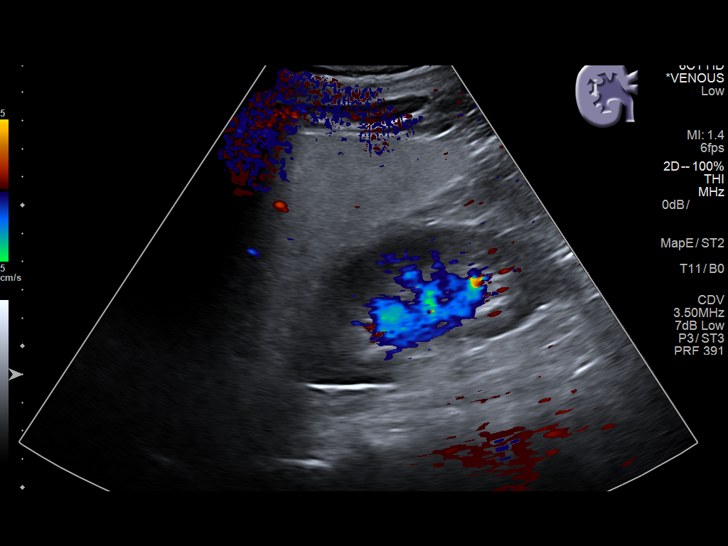
[im 5/24]
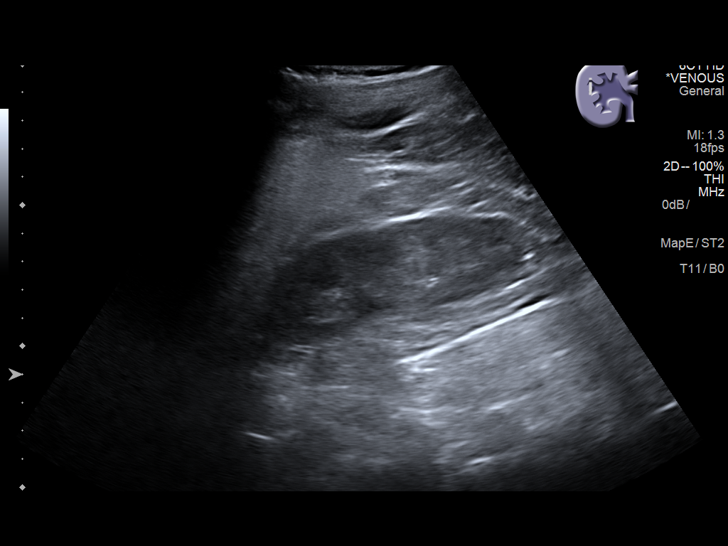
[im 7/24]
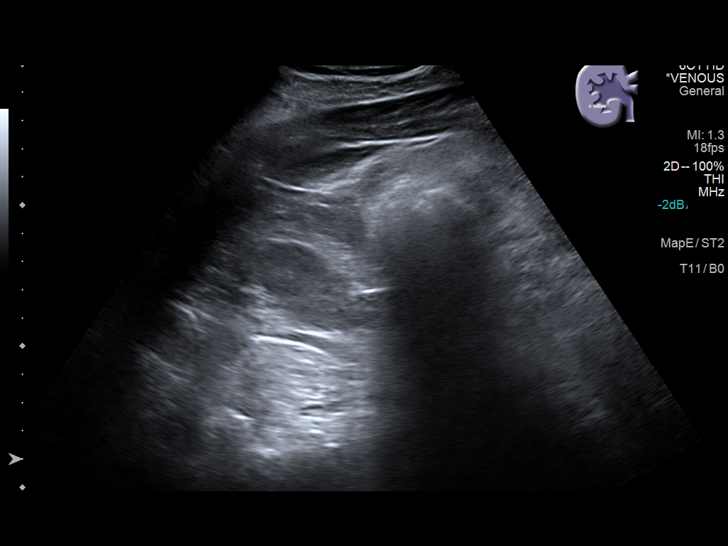
[im 8/24]
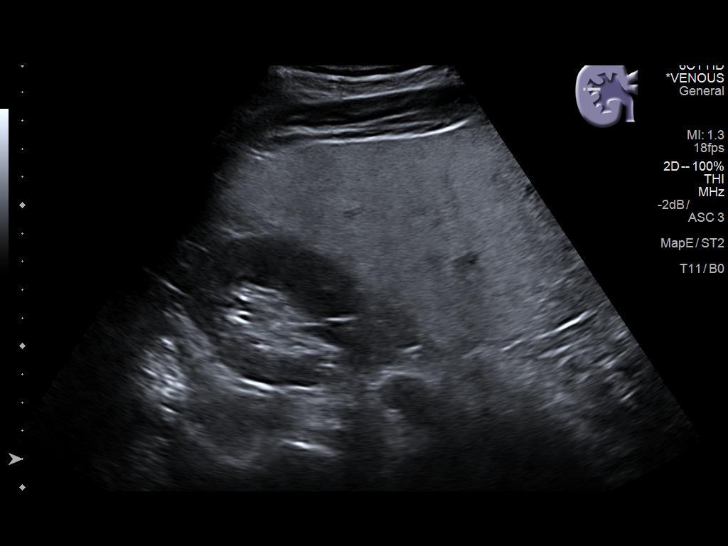
[im 10/24]
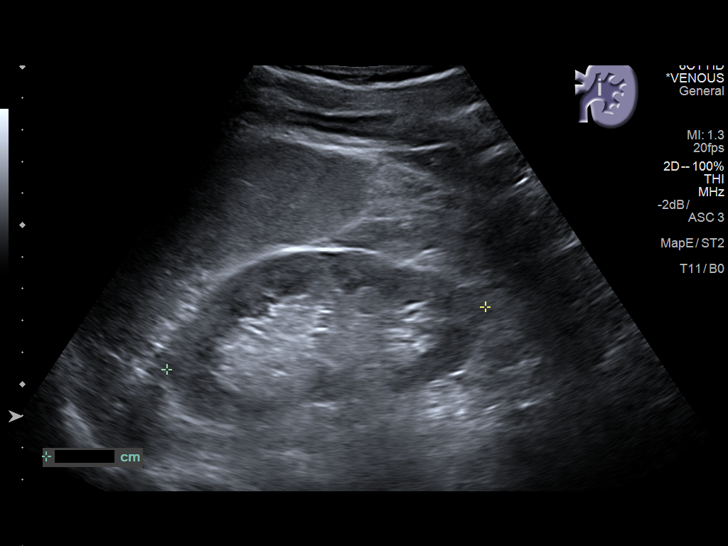
[im 12/24]
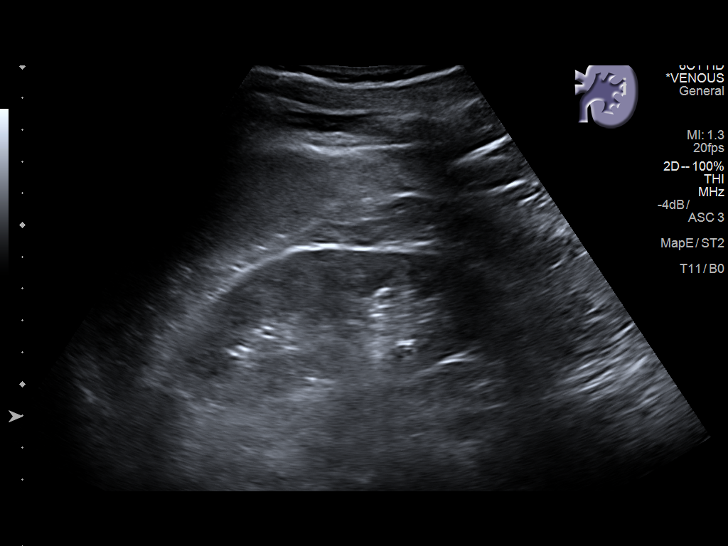
[im 13/24]
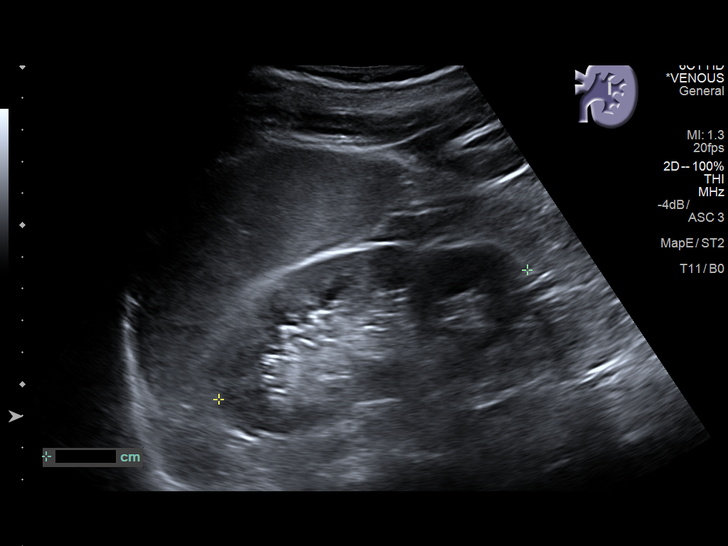
[im 15/24]
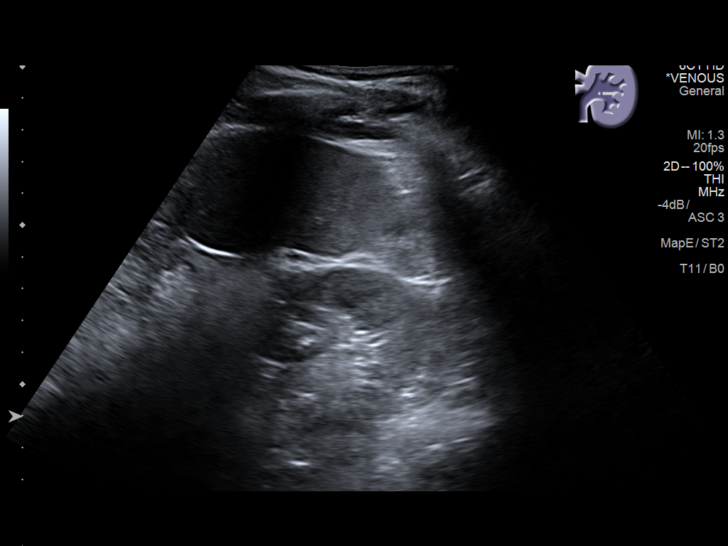
[im 17/24]
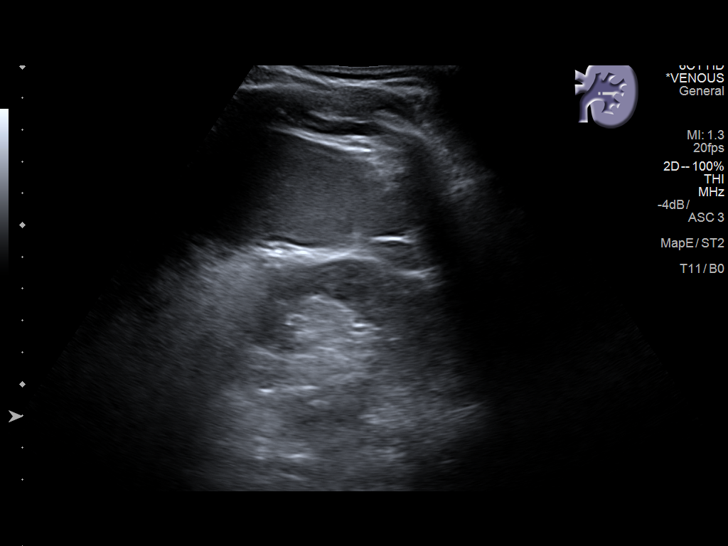
[im 19/24]
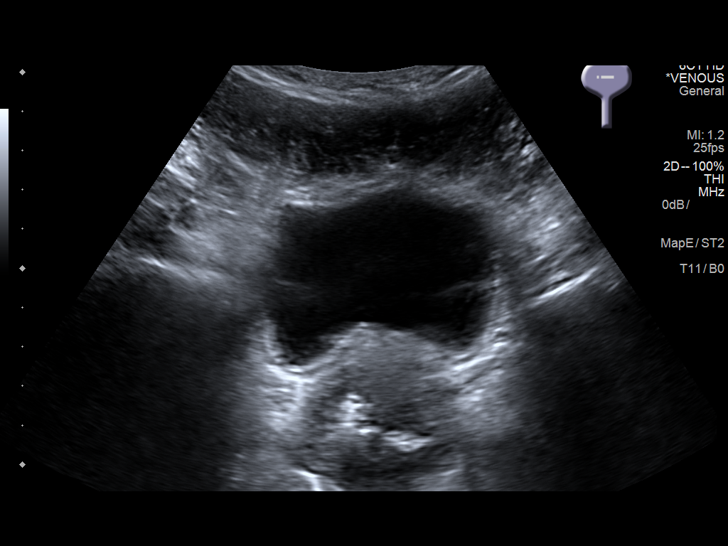
[im 20/24]
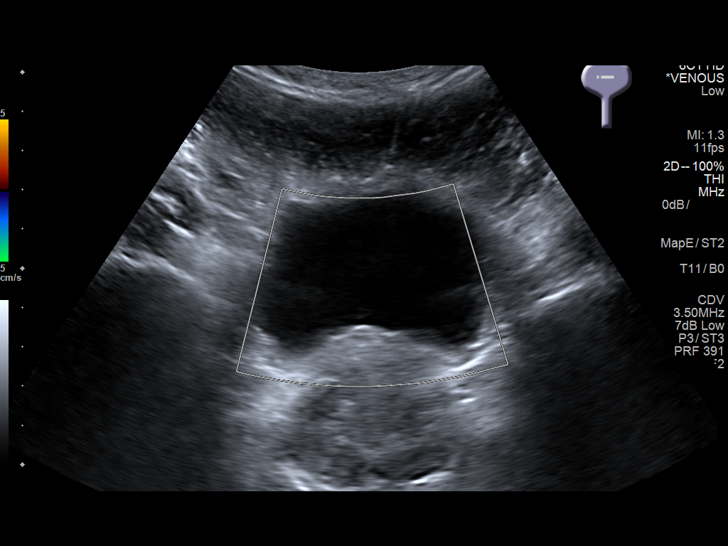
[im 22/24]
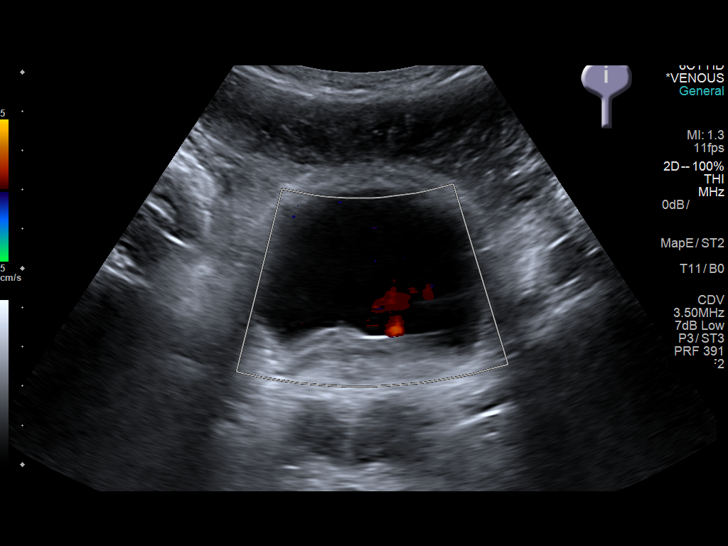
[im 24/24]
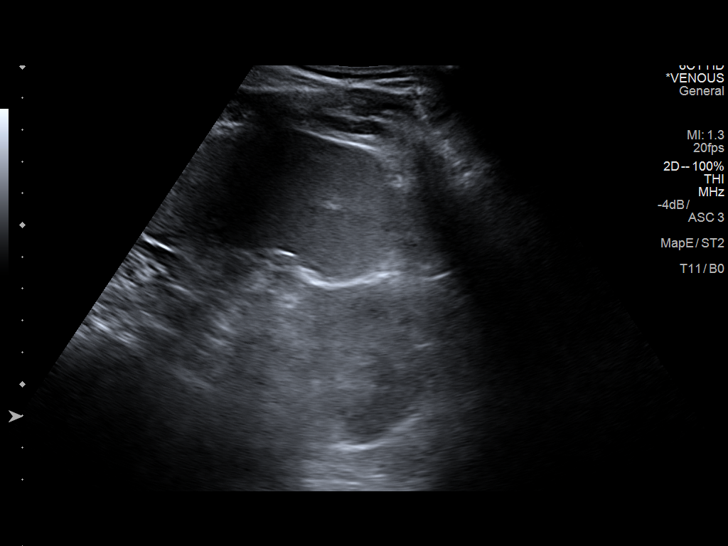

[14 of 24 positions shown; findings below may reference images not displayed]

FINDINGS: Right Kidney:

Length: 11.8 cm. Echogenicity and renal cortical thickness are
within normal limits. No mass, perinephric fluid, or hydronephrosis
visualized. No sonographically demonstrable calculus or
ureterectasis.

Left Kidney:

Length: 10.5 cm. Echogenicity and renal cortical thickness are
within normal limits. No mass, perinephric fluid, or hydronephrosis
visualized. No sonographically demonstrable calculus or
ureterectasis.

Bladder:

Appears normal for degree of bladder distention.
IMPRESSION: Study within normal limits.

## 2018-03-11 ENCOUNTER — Other Ambulatory Visit: Payer: Self-pay | Admitting: Family Medicine

## 2018-03-11 DIAGNOSIS — I10 Essential (primary) hypertension: Secondary | ICD-10-CM

## 2018-03-11 MED FILL — SILDENAFIL CITRATE 100 MG T: 100 | 20 days supply | Qty: 4 | Fill #1

## 2018-03-11 MED FILL — raNITIdine HCL 150 MG TABS: 150 | 30 days supply | Qty: 60 | Fill #4

## 2018-03-11 MED FILL — CHLORTHALIDONE 25 MG TABS: 25 | 30 days supply | Qty: 30 | Fill #5

## 2018-03-11 MED FILL — LEVOTHYROXINE 75 MCG TABLET: 75 | 30 days supply | Qty: 30 | Fill #5

## 2018-03-11 NOTE — Telephone Encounter (Signed)
Hyperkalemia risk alert populated while attempting to refill pended medications. Last K=4.1 on 01/03/18. Author routed to Dr. Nani Ravens to approve.

## 2018-04-07 ENCOUNTER — Other Ambulatory Visit: Payer: Self-pay | Admitting: Family Medicine

## 2018-04-07 MED FILL — CHLORTHALIDONE 25 MG TABS: 25 | 30 days supply | Qty: 30 | Fill #6

## 2018-04-07 MED FILL — LEVOTHYROXINE 75 MCG TABLET: 75 | 30 days supply | Qty: 30 | Fill #0

## 2018-04-07 MED FILL — SPIRONOLACTONE 25 MG TABLET: 25 | 30 days supply | Qty: 30 | Fill #1

## 2018-04-14 MED FILL — raNITIdine HCL 150 MG TABS: 150 | 30 days supply | Qty: 60 | Fill #5

## 2018-04-14 MED FILL — AMLODIPINE-VALSARTAN 5-320: 5-320 | 30 days supply | Qty: 30 | Fill #1

## 2018-05-09 MED FILL — CHLORTHALIDONE 25 MG TABS: 25 | 30 days supply | Qty: 30 | Fill #7

## 2018-05-09 MED FILL — LEVOTHYROXINE 75 MCG TABLET: 75 | 30 days supply | Qty: 30 | Fill #1

## 2018-05-09 MED FILL — AMLODIPINE-VALSARTAN 5-320: 5-320 | 30 days supply | Qty: 30 | Fill #2

## 2018-05-09 MED FILL — SPIRONOLACTONE 25 MG TABLET: 25 | 30 days supply | Qty: 30 | Fill #2

## 2018-05-20 ENCOUNTER — Telehealth: Payer: Self-pay | Admitting: Family Medicine

## 2018-05-20 NOTE — Telephone Encounter (Signed)
Patient came in, after going to the pharmacy to pick up meds. The pharmacy informed the pt that his ZANTAC has had a recall and requested to have another type sent in.

## 2018-05-23 MED ORDER — FAMOTIDINE 20 MG PO TABS: 20.0000 mg | ORAL_TABLET | Freq: Two times a day (BID) | ORAL | 5 refills | Status: DC

## 2018-05-23 MED FILL — FAMOTIDINE 20 MG TABLET: 20 | 30 days supply | Qty: 60 | Fill #0

## 2018-05-23 NOTE — Telephone Encounter (Signed)
Done

## 2018-06-10 ENCOUNTER — Other Ambulatory Visit: Payer: Self-pay | Admitting: Family Medicine

## 2018-06-10 DIAGNOSIS — I10 Essential (primary) hypertension: Secondary | ICD-10-CM

## 2018-06-10 MED FILL — LEVOTHYROXINE 75 MCG TABLET: 75 | 30 days supply | Qty: 30 | Fill #2

## 2018-06-10 MED FILL — SPIRONOLACTONE 25 MG TABLET: 25 | 30 days supply | Qty: 30 | Fill #0

## 2018-06-10 MED FILL — CHLORTHALIDONE 25 MG TABS: 25 | 30 days supply | Qty: 30 | Fill #8

## 2018-06-17 ENCOUNTER — Other Ambulatory Visit: Payer: Self-pay | Admitting: Family Medicine

## 2018-06-17 DIAGNOSIS — I10 Essential (primary) hypertension: Secondary | ICD-10-CM

## 2018-06-17 MED FILL — AMLODIPINE-VALSARTAN 5-320: 5-320 | 30 days supply | Qty: 30 | Fill #0

## 2018-06-21 ENCOUNTER — Other Ambulatory Visit: Payer: Self-pay

## 2018-06-21 DIAGNOSIS — I10 Essential (primary) hypertension: Secondary | ICD-10-CM

## 2018-06-21 MED ORDER — SPIRONOLACTONE 25 MG PO TABS: 25.0000 mg | ORAL_TABLET | Freq: Every day | ORAL | 2 refills | Status: DC

## 2018-06-21 MED ORDER — LEVOTHYROXINE SODIUM 75 MCG PO TABS: 75.0000 ug | ORAL_TABLET | Freq: Every day | ORAL | 5 refills | Status: DC

## 2018-06-21 MED ORDER — CHLORTHALIDONE 25 MG PO TABS: 25.0000 mg | ORAL_TABLET | Freq: Every day | ORAL | 2 refills | Status: DC

## 2018-06-21 MED ORDER — AMLODIPINE BESYLATE-VALSARTAN 5-320 MG PO TABS: 1.0000 | ORAL_TABLET | Freq: Every day | ORAL | 2 refills | Status: DC

## 2018-06-21 MED ORDER — FAMOTIDINE 20 MG PO TABS: 20.0000 mg | ORAL_TABLET | Freq: Two times a day (BID) | ORAL | 5 refills | Status: DC

## 2018-07-05 ENCOUNTER — Other Ambulatory Visit: Payer: Self-pay | Admitting: Family Medicine

## 2018-07-06 MED ORDER — SILDENAFIL CITRATE 100 MG PO TABS: 100.0000 mg | ORAL_TABLET | Freq: Every day | ORAL | 5 refills | Status: DC | PRN

## 2018-07-17 ENCOUNTER — Other Ambulatory Visit: Payer: Self-pay | Admitting: Family Medicine

## 2018-07-17 DIAGNOSIS — I10 Essential (primary) hypertension: Secondary | ICD-10-CM

## 2018-08-25 ENCOUNTER — Other Ambulatory Visit: Payer: Self-pay | Admitting: Family Medicine

## 2018-08-25 DIAGNOSIS — I10 Essential (primary) hypertension: Secondary | ICD-10-CM

## 2018-10-11 ENCOUNTER — Other Ambulatory Visit: Payer: Self-pay | Admitting: Family Medicine

## 2018-10-11 DIAGNOSIS — I10 Essential (primary) hypertension: Secondary | ICD-10-CM

## 2018-10-12 ENCOUNTER — Telehealth: Payer: Self-pay | Admitting: Family Medicine

## 2018-10-12 NOTE — Telephone Encounter (Signed)
Copied from [place] [id number]. Topic: Quick Communication - See Telephone Encounter >> Oct 11, 2018  3:51 PM Loma [place] wrote: CRM for notification. See Telephone encounter for: 10/11/18. 3 attempts Pt was denied his meds and has instructions to call to make appt.  Please FU with pt best [phone]  Called left message to call back to schedule virtual visit.

## 2018-10-12 NOTE — Telephone Encounter (Signed)
Pt called to schedule Virtual visit/ office was closed please call Pt to schedule. He works during the day and only answer the phone between 2:30pm and 4pm

## 2018-10-14 NOTE — Telephone Encounter (Signed)
Called to schedule virtual visit--left message to call--He will need to schedule if we cannot get him on the phone.

## 2018-10-14 NOTE — Telephone Encounter (Signed)
Patient scheduled virtual visit on 10/17/2018

## 2018-10-17 ENCOUNTER — Other Ambulatory Visit: Payer: Self-pay

## 2018-10-17 ENCOUNTER — Ambulatory Visit (INDEPENDENT_AMBULATORY_CARE_PROVIDER_SITE_OTHER): Payer: BLUE CROSS/BLUE SHIELD | Admitting: Family Medicine

## 2018-10-17 ENCOUNTER — Encounter: Payer: Self-pay | Admitting: Family Medicine

## 2018-10-17 DIAGNOSIS — I10 Essential (primary) hypertension: Secondary | ICD-10-CM | POA: Diagnosis not present

## 2018-10-17 DIAGNOSIS — K219 Gastro-esophageal reflux disease without esophagitis: Secondary | ICD-10-CM

## 2018-10-17 MED ORDER — AMLODIPINE BESYLATE-VALSARTAN 5-320 MG PO TABS: 1.0000 | ORAL_TABLET | Freq: Every day | ORAL | 2 refills | Status: DC

## 2018-10-17 NOTE — Progress Notes (Signed)
Chief Complaint  Patient presents with  . Medication Refill    Subjective Austin Boyd is a 53 y.o. male who presents for hypertension follow up. Due to COVID-19 pandemic, we are interacting via web portal for an electronic face-to-face visit. I verified patient's ID using 2 identifiers. Patient agreed to proceed with visit via this method. Patient is at home, I am at office. Patient and I are present for visit.   He does monitor home blood pressures. Blood pressures ranging from 130's/80's on average. He is compliant with medications- Exforge 5-320 mg/d, spironolactone 25 mg/d, chlorthalidone 25 mg/d. Patient has these side effects of medication: none He is adhering to a healthy diet overall. Current exercise: wt lifting, some cardio  Hx of GERD controlled with Pepcid 20 mg bid. Diet/exercise as above. Tries to avoid triggers.    Past Medical History:  Diagnosis Date  . Anxiety    scared of needles  . Chicken pox   . GERD (gastroesophageal reflux disease)   . 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 Hypertension   . NAFLD (nonalcoholic fatty liver disease)    [place] 2016  . PONV (postoperative nausea and vomiting)   . Thyroid disease   . Wears contact lenses     Review of Systems Cardiovascular: no chest pain Respiratory:  no shortness of breath  Exam No conversational dyspnea Age appropriate judgment and insight Nml affect and mood  Essential hypertension - Plan: amLODipine-valsartan (EXFORGE) 5-320 MG tablet, Basic metabolic panel  Gastroesophageal reflux disease, esophagitis presence not specified  1-Cont meds, ck BMP. Counseled on diet and exercise. 2- try going off of H2. OK to use prn.  F/u in 6 mo for CPE or prn. The patient voiced understanding and agreement to the plan.  [place], DO 10/17/18  4:25 PM

## 2018-10-18 ENCOUNTER — Telehealth: Payer: Self-pay | Admitting: Family Medicine

## 2018-10-18 NOTE — Telephone Encounter (Signed)
lvm for pt to call and schedule cpe/labs appt

## 2018-10-20 ENCOUNTER — Other Ambulatory Visit: Payer: Self-pay

## 2018-10-20 ENCOUNTER — Other Ambulatory Visit (INDEPENDENT_AMBULATORY_CARE_PROVIDER_SITE_OTHER): Payer: BLUE CROSS/BLUE SHIELD

## 2018-10-20 DIAGNOSIS — I10 Essential (primary) hypertension: Secondary | ICD-10-CM

## 2018-10-21 LAB — BASIC METABOLIC PANEL
BUN: 28 mg/dL — ABNORMAL HIGH (ref 6–23)
CO2: 31 mEq/L (ref 19–32)
Calcium: 9.5 mg/dL (ref 8.4–10.5)
Chloride: 97 mEq/L (ref 96–112)
Creatinine, Ser: 1.46 mg/dL (ref 0.40–1.50)
GFR: 50.53 mL/min — ABNORMAL LOW (ref >60.00)
Glucose, Bld: 94 mg/dL (ref 70–99)
Potassium: 3.8 mEq/L (ref 3.5–5.1)
Sodium: 137 mEq/L (ref 135–145)

## 2018-12-14 DIAGNOSIS — H2000 Unspecified acute and subacute iridocyclitis: Secondary | ICD-10-CM | POA: Diagnosis not present

## 2018-12-17 ENCOUNTER — Other Ambulatory Visit: Payer: Self-pay | Admitting: Family Medicine

## 2018-12-17 DIAGNOSIS — I10 Essential (primary) hypertension: Secondary | ICD-10-CM

## 2018-12-21 DIAGNOSIS — H2000 Unspecified acute and subacute iridocyclitis: Secondary | ICD-10-CM | POA: Diagnosis not present

## 2019-01-02 ENCOUNTER — Encounter: Payer: Self-pay | Admitting: Family Medicine

## 2019-01-04 ENCOUNTER — Other Ambulatory Visit: Payer: Self-pay

## 2019-01-04 ENCOUNTER — Ambulatory Visit (INDEPENDENT_AMBULATORY_CARE_PROVIDER_SITE_OTHER): Payer: BC Managed Care – PPO | Admitting: Family Medicine

## 2019-01-04 ENCOUNTER — Encounter: Payer: Self-pay | Admitting: Family Medicine

## 2019-01-04 ENCOUNTER — Other Ambulatory Visit: Payer: Self-pay | Admitting: Family Medicine

## 2019-01-04 VITALS — BP 112/72 | HR 93 | Temp 98.9°F | Ht 66.0 in | Wt 189.0 lb

## 2019-01-04 DIAGNOSIS — M722 Plantar fascial fibromatosis: Secondary | ICD-10-CM | POA: Diagnosis not present

## 2019-01-04 DIAGNOSIS — I10 Essential (primary) hypertension: Secondary | ICD-10-CM

## 2019-01-04 NOTE — Patient Instructions (Addendum)
Ice/cold pack over area for 10-15 min twice daily.  Consider a [place] sock and Powerstep insoles. Dr. Felicie Morn is a cheaper option, but may not be as effective.  OK to take Tylenol 1000 mg (2 extra strength tabs) or 975 mg (3 regular strength tabs) every 6 hours as needed.  I don't recommend baby aspirin to prevent heart attack or stroke for you.  Plantar Fasciitis Stretches/exercises Do exercises exactly as told by your health care provider and adjust them as directed. It is normal to feel mild stretching, pulling, tightness, or discomfort as you do these exercises, but you should stop right away if you feel sudden pain or your pain gets worse.   Stretching and range of motion exercises These exercises warm up your muscles and joints and improve the movement and flexibility of your foot. These exercises also help to relieve pain.  Exercise A: Plantar fascia stretch 1. Sit with your left / right leg crossed over your opposite knee. 2. Hold your heel with one hand with that thumb near your arch. With your other hand, hold your toes and gently pull them back toward the top of your foot. You should feel a stretch on the bottom of your toes or your foot or both. 3. Hold this stretch for 30 seconds. 4. Slowly release your toes and return to the starting position. Repeat 2 times. Complete this exercise 3 times per week.  Exercise B: Gastroc, standing 1. Stand with your hands against a wall. 2. Extend your left / right leg behind you, and bend your front knee slightly. 3. Keeping your heels on the floor and keeping your back knee straight, shift your weight toward the wall without arching your back. You should feel a gentle stretch in your left / right calf. 4. Hold this position for 30 seconds. Repeat 2 times. Complete this exercise 3 times a week. Exercise C: Soleus, standing 1. Stand with your hands against a wall. 2. Extend your left / right leg behind you, and bend your front knee  slightly. 3. Keeping your heels on the floor, bend your back knee and slightly shift your weight over the back leg. You should feel a gentle stretch deep in your calf. 4. Hold this position for 30 seconds. Repeat 2 times. Complete this exercise 3 times per week. Exercise D: Gastrocsoleus, standing 1. Stand with the ball of your left / right foot on a step. The ball of your foot is on the walking surface, right under your toes. 2. Keep your other foot firmly on the same step. 3. Hold onto the wall or a railing for balance. 4. Slowly lift your other foot, allowing your body weight to press your heel down over the edge of the step. You should feel a stretch in your left / right calf. 5. Hold this position for 30 seconds. 6. Return both feet to the step. 7. Repeat this exercise with a slight bend in your left / right knee. Repeat 2 times with your left / right knee straight and 2times with your left / right knee bent. Complete this exercise 3 times a week.  Balance exercise This exercise builds your balance and strength control of your arch to help take pressure off your plantar fascia. Exercise E: Single leg stand 1. Without shoes, stand near a railing or in a doorway. You may hold onto the railing or door frame as needed. 2. Stand on your left / right foot. Keep your big toe down on the  floor and try to keep your arch lifted. Do not let your foot roll inward. 3. Hold this position for 30 seconds. 4. If this exercise is too easy, you can try it with your eyes closed or while standing on a pillow. Repeat 2 times. Complete this exercise 3 times per week. This information is not intended to replace advice given to you by your health care provider. Make sure you discuss any questions you have with your health care provider. Document Released: 06/01/2005 Document Revised: 02/04/2016 Document Reviewed: 04/15/2015 Elsevier Interactive Patient Education  2017 [employer].

## 2019-01-04 NOTE — Progress Notes (Signed)
Musculoskeletal Exam  Patient: Austin Boyd DOB: 06-Nov-1965  DOS: 01/04/2019  SUBJECTIVE:  Chief Complaint:   Chief Complaint  Patient presents with  . Foot Pain    right    Austin Boyd is a 53 y.o.  male for evaluation and treatment of R foot pain.   Onset:  2 months ago. No inj or change in activity.  Location: bottom of R heel Character:  sharp  Progression of issue:  is unchanged Associated symptoms: mild swelling, no bruising or redness Treatment: to date has been thicker socks.   Neurovascular symptoms: no  ROS: Musculoskeletal/Extremities: +R foot pain  Past Medical History:  Diagnosis Date  . Anxiety    scared of needles  . Chicken pox   . GERD (gastroesophageal reflux disease)   . 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 Hypertension   . NAFLD (nonalcoholic fatty liver disease)    [place] 2016  . PONV (postoperative nausea and vomiting)   . Thyroid disease   . Wears contact lenses     Objective: VITAL SIGNS: BP 112/72 (BP Location: Left Arm, Patient Position: Sitting, Cuff Size: Normal)   Pulse 93   Temp 98.9 F (37.2 C) (Oral)   Ht 5\' 6"  (1.676 m)   Wt 189 lb (85.7 kg)   SpO2 96%   BMI 30.51 kg/m  Constitutional: Well formed, well developed. No acute distress. Cardiovascular: Brisk cap refill Thorax & Lungs: No accessory muscle use Musculoskeletal: R foot.   Normal active range of motion: yes.   Normal passive range of motion: yes Tenderness to palpation: yes over prox insertion of R plantar fascia Deformity: no Ecchymosis: no Neurologic: Normal sensory function.  Psychiatric: Normal mood. Age appropriate judgment and insight. Alert & oriented x 3.    Assessment:  Plantar fasciitis, right - Plan: [place] sock, arch support, stretches/exercises, ice, Tylenol. Offered injection, but given his preference, will start with PT prior to offering this again.  Plan: Orders as above. F/u prn. The patient  voiced understanding and agreement to the plan.   [place], DO 01/04/19  3:39 PM

## 2019-02-14 ENCOUNTER — Encounter: Payer: Self-pay | Admitting: Family Medicine

## 2019-02-15 ENCOUNTER — Other Ambulatory Visit: Payer: Self-pay | Admitting: Family Medicine

## 2019-02-15 DIAGNOSIS — M722 Plantar fascial fibromatosis: Secondary | ICD-10-CM

## 2019-02-27 ENCOUNTER — Ambulatory Visit: Payer: BC Managed Care – PPO | Admitting: Podiatry

## 2019-02-27 ENCOUNTER — Other Ambulatory Visit: Payer: Self-pay

## 2019-02-27 ENCOUNTER — Encounter: Payer: Self-pay | Admitting: Podiatry

## 2019-02-27 ENCOUNTER — Ambulatory Visit (INDEPENDENT_AMBULATORY_CARE_PROVIDER_SITE_OTHER): Payer: BC Managed Care – PPO

## 2019-02-27 ENCOUNTER — Other Ambulatory Visit: Payer: Self-pay | Admitting: Podiatry

## 2019-02-27 DIAGNOSIS — M722 Plantar fascial fibromatosis: Secondary | ICD-10-CM

## 2019-02-27 DIAGNOSIS — M7671 Peroneal tendinitis, right leg: Secondary | ICD-10-CM

## 2019-02-27 DIAGNOSIS — M79671 Pain in right foot: Secondary | ICD-10-CM | POA: Diagnosis not present

## 2019-02-27 MED ORDER — DICLOFENAC SODIUM 75 MG PO TBEC: 75.0000 mg | DELAYED_RELEASE_TABLET | Freq: Two times a day (BID) | ORAL | 2 refills | Status: DC

## 2019-02-27 NOTE — Patient Instructions (Signed)

## 2019-02-27 NOTE — Progress Notes (Signed)
Subjective:   Patient ID: Austin Boyd, male   DOB: 53 y.o.   MRN: [id number]   HPI Patient presents with exquisite discomfort plantar heel right and also states he started developed pain on the outside of his foot as he thinks he is walking differently.  Patient states it is been going on for around a year and worse over the last 3 months and does not smoke likes to be active   Review of Systems  All other systems reviewed and are negative.       Objective:  Physical Exam Vitals signs and nursing note reviewed.  Constitutional:      Appearance: He is well-developed.  Pulmonary:     Effort: Pulmonary effort is normal.  Musculoskeletal: Normal range of motion.  Skin:    General: Skin is warm.  Neurological:     Mental Status: He is alert.     Neurovascular status intact muscle strength found to be adequate range of motion within normal limits.  Patient is found to have exquisite discomfort plantar heel right at the insertional point of the tendon into the calcaneus with inflammation fluid around the medial band and is found to have good digital perfusion well oriented x3 with mild discomfort in the outside of the foot around the peroneal tendon     Assessment:  Acute plantar fasciitis right with inflammation fluid of the medial band and probable compensatory peroneal tendinitis     Plan:  H&P x-ray reviewed condition discussed and at this point injected the fascia right 3 mg Kenalog 5 mg Xylocaine applied fascial brace gave instructions on physical therapy anti-inflammatories and discussed possible treatment of the peroneal tendon.  X-rays indicate that there is spur formation no indications of stress fracture arthritis after he signed his

## 2019-03-13 ENCOUNTER — Encounter: Payer: Self-pay | Admitting: Podiatry

## 2019-03-13 ENCOUNTER — Ambulatory Visit: Payer: BC Managed Care – PPO | Admitting: Podiatry

## 2019-03-13 ENCOUNTER — Other Ambulatory Visit: Payer: Self-pay

## 2019-03-13 DIAGNOSIS — M7671 Peroneal tendinitis, right leg: Secondary | ICD-10-CM

## 2019-03-13 DIAGNOSIS — M722 Plantar fascial fibromatosis: Secondary | ICD-10-CM

## 2019-03-13 NOTE — Progress Notes (Signed)
Subjective:   Patient ID: Austin Boyd, male   DOB: 53 y.o.   MRN: [id number]   HPI Patient states that heels feeling quite a bit better but still having some discomfort upon movement or activity with patient working on cement floors and has flatfeet and is looking for something to hold the arch up long-term   ROS      Objective:  Physical Exam  Neurovascular status intact with discomfort in the plantar aspect right heel that still present and tender with palpation     Assessment:  Plantar fasciitis right improved but still tender upon deep palpation     Plan:  H&P reviewed condition and recommended anti-inflammatories physical therapy continued support and went ahead and casted for functional orthotics to lift the arch.  Patient will be seen back to recheck

## 2019-03-23 ENCOUNTER — Other Ambulatory Visit: Payer: Self-pay | Admitting: Family Medicine

## 2019-03-23 DIAGNOSIS — I10 Essential (primary) hypertension: Secondary | ICD-10-CM

## 2019-03-30 ENCOUNTER — Telehealth: Payer: Self-pay

## 2019-03-30 NOTE — Telephone Encounter (Signed)
PA initiated via Covermymeds; KEY: [id number]. PA approved.   CaseId:[id number];Status:Approved;Review Type:Prior Auth;Coverage Start Date:02/28/2019;Coverage End Date:03/29/2020

## 2019-04-10 ENCOUNTER — Other Ambulatory Visit: Payer: Self-pay

## 2019-04-10 ENCOUNTER — Ambulatory Visit: Payer: BC Managed Care – PPO | Admitting: Orthotics

## 2019-04-10 DIAGNOSIS — M79671 Pain in right foot: Secondary | ICD-10-CM

## 2019-04-10 DIAGNOSIS — M7671 Peroneal tendinitis, right leg: Secondary | ICD-10-CM

## 2019-04-10 DIAGNOSIS — M722 Plantar fascial fibromatosis: Secondary | ICD-10-CM

## 2019-04-10 NOTE — Progress Notes (Signed)
Patient came in today to pick up custom made foot orthotics.  The goals were accomplished and the patient reported no dissatisfaction with said orthotics.  Patient was advised of breakin period and how to report any issues. 

## 2019-06-04 ENCOUNTER — Other Ambulatory Visit: Payer: Self-pay | Admitting: Family Medicine

## 2019-06-04 DIAGNOSIS — I10 Essential (primary) hypertension: Secondary | ICD-10-CM

## 2019-06-06 ENCOUNTER — Other Ambulatory Visit: Payer: Self-pay | Admitting: Podiatry

## 2019-06-20 ENCOUNTER — Other Ambulatory Visit: Payer: Self-pay | Admitting: Family Medicine

## 2019-06-20 DIAGNOSIS — I10 Essential (primary) hypertension: Secondary | ICD-10-CM

## 2019-08-13 ENCOUNTER — Other Ambulatory Visit: Payer: Self-pay | Admitting: Podiatry

## 2019-11-05 ENCOUNTER — Other Ambulatory Visit: Payer: Self-pay | Admitting: Family Medicine

## 2019-11-05 DIAGNOSIS — I10 Essential (primary) hypertension: Secondary | ICD-10-CM

## 2019-11-20 ENCOUNTER — Other Ambulatory Visit: Payer: Self-pay | Admitting: Family Medicine

## 2019-11-20 DIAGNOSIS — I10 Essential (primary) hypertension: Secondary | ICD-10-CM

## 2020-02-18 ENCOUNTER — Other Ambulatory Visit: Payer: Self-pay | Admitting: Family Medicine

## 2020-02-18 DIAGNOSIS — I10 Essential (primary) hypertension: Secondary | ICD-10-CM

## 2020-03-20 ENCOUNTER — Other Ambulatory Visit: Payer: Self-pay | Admitting: Family Medicine

## 2020-03-20 DIAGNOSIS — I10 Essential (primary) hypertension: Secondary | ICD-10-CM

## 2020-04-08 ENCOUNTER — Other Ambulatory Visit: Payer: Self-pay | Admitting: Family Medicine

## 2020-04-08 DIAGNOSIS — I10 Essential (primary) hypertension: Secondary | ICD-10-CM

## 2020-05-18 ENCOUNTER — Other Ambulatory Visit: Payer: Self-pay | Admitting: Family Medicine

## 2020-05-18 DIAGNOSIS — I10 Essential (primary) hypertension: Secondary | ICD-10-CM

## 2020-05-19 ENCOUNTER — Other Ambulatory Visit: Payer: Self-pay | Admitting: Family Medicine

## 2020-07-27 ENCOUNTER — Other Ambulatory Visit: Payer: Self-pay | Admitting: Family Medicine

## 2020-09-23 ENCOUNTER — Other Ambulatory Visit: Payer: Self-pay | Admitting: Family Medicine

## 2020-09-23 DIAGNOSIS — I10 Essential (primary) hypertension: Secondary | ICD-10-CM

## 2020-10-11 ENCOUNTER — Telehealth: Payer: Self-pay

## 2020-10-11 ENCOUNTER — Other Ambulatory Visit: Payer: Self-pay

## 2020-10-11 ENCOUNTER — Ambulatory Visit: Payer: BC Managed Care – PPO | Admitting: Family Medicine

## 2020-10-11 ENCOUNTER — Encounter: Payer: Self-pay | Admitting: Family Medicine

## 2020-10-11 VITALS — BP 118/84 | HR 82 | Temp 98.4°F | Ht 66.0 in | Wt 186.0 lb

## 2020-10-11 DIAGNOSIS — E039 Hypothyroidism, unspecified: Secondary | ICD-10-CM

## 2020-10-11 DIAGNOSIS — R5383 Other fatigue: Secondary | ICD-10-CM

## 2020-10-11 DIAGNOSIS — I10 Essential (primary) hypertension: Secondary | ICD-10-CM

## 2020-10-11 MED ORDER — SILDENAFIL CITRATE 100 MG PO TABS
ORAL_TABLET | ORAL | 2 refills | Status: DC
Start: 2020-10-11 — End: 2020-10-11

## 2020-10-11 MED ORDER — SILDENAFIL CITRATE 100 MG PO TABS: ORAL_TABLET | ORAL | 2 refills | Status: DC

## 2020-10-11 NOTE — Patient Instructions (Signed)
Give [place] 2-3 business days to get the results of your labs back.   If labs are normal, we will get an ultrasound of your heart called an echocardiogram.  Keep the diet clean and stay active.  Try to drink 55-60 oz of water daily outside of exercise/sweating at work.  Use GoodRx for your Viagra. This is a free app and free website.   Let us know if you need anything.

## 2020-10-11 NOTE — Progress Notes (Signed)
Chief Complaint  Patient presents with  . Dizziness  . Fatigue    Subjective Austin Boyd is a 55 y.o. male who presents for hypertension follow up.  He is here with his wife. He does monitor home blood pressures. Blood pressures ranging from 120-130's/70-80's on average. He is compliant with medications- chlorthalidone 25 mg/d, Exforge 5-320 mg/d, spironolactone 25 mg/d. Patient has these side effects of medication: none He is adhering to a healthy diet overall. Current exercise: lifting wts, elliptical machine No CP or SOB.  Hypothyroidism Patient presents for follow-up of hypothyroidism.  Reports compliance with medication- levothyroxine 75 mcg/d. Current symptoms include: Fatigue and lightheadedness over the past couple days. He admits he does not drink as much water as he probably should. Denies weight changes, heat/cold intolerance, bowel/skin changes or CVS symptoms   Past Medical History:  Diagnosis Date  . Anxiety    scared of needles  . Chicken pox   . GERD (gastroesophageal reflux disease)   . 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 Hypertension   . NAFLD (nonalcoholic fatty liver disease)    [place] 2016  . PONV (postoperative nausea and vomiting)   . Thyroid disease   . Wears contact lenses     Exam BP 118/84 (BP Location: Left Arm, Patient Position: Sitting, Cuff Size: Normal)   Pulse 82   Temp 98.4 F (36.9 C) (Oral)   Ht 5\' 6"  (1.676 m)   Wt 186 lb (84.4 kg)   SpO2 99%   BMI 30.02 kg/m  General:  well developed, well nourished, in no apparent distress Heart: RRR, no bruits, no LE edema Lungs: clear to auscultation, no accessory muscle use Psych: well oriented with normal range of affect and appropriate judgment/insight  Essential hypertension - Plan: TSH, T4, Comprehensive metabolic panel  Hypothyroidism, unspecified type - Plan: TSH, T4  1.  Continue Exforge 5-320 mg daily, chlorthalidone 25 mg daily,  spironolactone 25 mg daily.  Does not need to monitor blood pressure at home.  Monitor labs as above.  Counseled on diet and exercise. 2.  Check above labs, currently continue levothyroxine 75 mcg daily.  If labs do not explain his symptoms, we will order an echo. F/u in 6 months for a physical pending the above. The patient and his spouse voiced understanding and agreement to the plan.  [place], DO 10/11/20  4:14 PM

## 2020-10-11 NOTE — Telephone Encounter (Signed)
Called and schedule appt with PCP today at 4

## 2020-10-11 NOTE — Telephone Encounter (Signed)
Caller states her husband takes medication for thyroids and isn't feeling right.   Telephone: [phone]

## 2020-10-12 LAB — CBC
HCT: 52.4 % — ABNORMAL HIGH (ref 38.5–50.0)
Hemoglobin: 17.8 g/dL — ABNORMAL HIGH (ref 13.2–17.1)
MCH: 31.1 pg (ref 27.0–33.0)
MCHC: 34 g/dL (ref 32.0–36.0)
MCV: 91.4 fL (ref 80.0–100.0)
MPV: 9 fL (ref 7.5–12.5)
Platelets: 246 10*3/uL (ref 140–400)
RBC: 5.73 10*6/uL (ref 4.20–5.80)
RDW: 12.4 % (ref 11.0–15.0)
WBC: 7.1 10*3/uL (ref 3.8–10.8)

## 2020-10-12 LAB — COMPREHENSIVE METABOLIC PANEL
AG Ratio: 1.6 (calc) (ref 1.0–2.5)
ALT: 48 U/L — ABNORMAL HIGH (ref 9–46)
AST: 39 U/L — ABNORMAL HIGH (ref 10–35)
Albumin: 4.2 g/dL (ref 3.6–5.1)
Alkaline phosphatase (APISO): 43 U/L (ref 35–144)
BUN: 22 mg/dL (ref 7–25)
CO2: 30 mmol/L (ref 20–32)
Calcium: 9.8 mg/dL (ref 8.6–10.3)
Chloride: 98 mmol/L (ref 98–110)
Creat: 1.18 mg/dL (ref 0.70–1.33)
Globulin: 2.7 g/dL (calc) (ref 1.9–3.7)
Glucose, Bld: 90 mg/dL (ref 65–99)
Potassium: 3.6 mmol/L (ref 3.5–5.3)
Sodium: 137 mmol/L (ref 135–146)
Total Bilirubin: 0.5 mg/dL (ref 0.2–1.2)
Total Protein: 6.9 g/dL (ref 6.1–8.1)

## 2020-10-12 LAB — VITAMIN D 25 HYDROXY (VIT D DEFICIENCY, FRACTURES): Vit D, 25-Hydroxy: 31 ng/mL (ref 30–100)

## 2020-10-12 LAB — TSH: TSH: 2.38 mIU/L (ref 0.40–4.50)

## 2020-10-12 LAB — VITAMIN B12: Vitamin B-12: 554 pg/mL (ref 200–1100)

## 2020-10-12 LAB — T4: T4, Total: 6.5 ug/dL (ref 4.9–10.5)

## 2020-10-14 ENCOUNTER — Other Ambulatory Visit: Payer: Self-pay | Admitting: *Deleted

## 2020-10-14 ENCOUNTER — Telehealth: Payer: Self-pay | Admitting: *Deleted

## 2020-10-14 DIAGNOSIS — D582 Other hemoglobinopathies: Secondary | ICD-10-CM

## 2020-10-14 NOTE — Telephone Encounter (Signed)
PRIOR AUTH STARTED FOR VIAGRA VIA COVER MY MEDS KEY: [id number]  AWAITING DETERMINATION

## 2020-10-14 NOTE — Telephone Encounter (Signed)
VZDGLO:[id number];JJOACZ:[id number];Review Type:Prior Auth;Coverage Start Date:09/14/2020;Coverage End Date:10/14/2021;

## 2020-10-14 NOTE — Telephone Encounter (Signed)
Error

## 2020-10-18 ENCOUNTER — Other Ambulatory Visit (INDEPENDENT_AMBULATORY_CARE_PROVIDER_SITE_OTHER): Payer: BC Managed Care – PPO

## 2020-10-18 ENCOUNTER — Other Ambulatory Visit: Payer: Self-pay

## 2020-10-18 DIAGNOSIS — D582 Other hemoglobinopathies: Secondary | ICD-10-CM

## 2020-10-18 LAB — CBC WITH DIFFERENTIAL/PLATELET
Absolute Monocytes: 655 cells/uL (ref 200–950)
Basophils Absolute: 39 cells/uL (ref 0–200)
Basophils Relative: 0.5 %
Eosinophils Absolute: 108 cells/uL (ref 15–500)
Eosinophils Relative: 1.4 %
HCT: 47.4 % (ref 38.5–50.0)
Hemoglobin: 16.1 g/dL (ref 13.2–17.1)
Lymphs Abs: 1109 cells/uL (ref 850–3900)
MCH: 31.2 pg (ref 27.0–33.0)
MCHC: 34 g/dL (ref 32.0–36.0)
MCV: 91.9 fL (ref 80.0–100.0)
MPV: 9.2 fL (ref 7.5–12.5)
Monocytes Relative: 8.5 %
Neutro Abs: 5790 cells/uL (ref 1500–7800)
Neutrophils Relative %: 75.2 %
Platelets: 235 10*3/uL (ref 140–400)
RBC: 5.16 10*6/uL (ref 4.20–5.80)
RDW: 12.4 % (ref 11.0–15.0)
Total Lymphocyte: 14.4 %
WBC: 7.7 10*3/uL (ref 3.8–10.8)

## 2020-12-23 ENCOUNTER — Other Ambulatory Visit: Payer: Self-pay | Admitting: Family Medicine

## 2020-12-23 DIAGNOSIS — I10 Essential (primary) hypertension: Secondary | ICD-10-CM

## 2020-12-24 ENCOUNTER — Other Ambulatory Visit: Payer: Self-pay | Admitting: Family Medicine

## 2020-12-24 DIAGNOSIS — I10 Essential (primary) hypertension: Secondary | ICD-10-CM

## 2021-01-08 ENCOUNTER — Other Ambulatory Visit: Payer: Self-pay | Admitting: Family Medicine

## 2021-01-08 DIAGNOSIS — I10 Essential (primary) hypertension: Secondary | ICD-10-CM

## 2021-03-25 ENCOUNTER — Other Ambulatory Visit: Payer: Self-pay | Admitting: Family Medicine

## 2021-03-25 DIAGNOSIS — I10 Essential (primary) hypertension: Secondary | ICD-10-CM

## 2021-03-29 ENCOUNTER — Emergency Department (HOSPITAL_BASED_OUTPATIENT_CLINIC_OR_DEPARTMENT_OTHER): Payer: BC Managed Care – PPO

## 2021-03-29 ENCOUNTER — Other Ambulatory Visit: Payer: Self-pay

## 2021-03-29 ENCOUNTER — Emergency Department (HOSPITAL_BASED_OUTPATIENT_CLINIC_OR_DEPARTMENT_OTHER)
Admission: EM | Admit: 2021-03-29 | Discharge: 2021-03-29 | Disposition: A | Discharge status: Home / Self Care | Payer: BC Managed Care – PPO | Attending: Emergency Medicine | Admitting: Emergency Medicine

## 2021-03-29 ENCOUNTER — Other Ambulatory Visit: Payer: Self-pay | Admitting: Family Medicine

## 2021-03-29 ENCOUNTER — Encounter (HOSPITAL_BASED_OUTPATIENT_CLINIC_OR_DEPARTMENT_OTHER): Payer: Self-pay

## 2021-03-29 DIAGNOSIS — I10 Essential (primary) hypertension: Secondary | ICD-10-CM | POA: Insufficient documentation

## 2021-03-29 DIAGNOSIS — E039 Hypothyroidism, unspecified: Secondary | ICD-10-CM | POA: Insufficient documentation

## 2021-03-29 DIAGNOSIS — R0602 Shortness of breath: Secondary | ICD-10-CM | POA: Diagnosis not present

## 2021-03-29 DIAGNOSIS — Z79899 Other long term (current) drug therapy: Secondary | ICD-10-CM | POA: Insufficient documentation

## 2021-03-29 DIAGNOSIS — R002 Palpitations: Secondary | ICD-10-CM | POA: Diagnosis not present

## 2021-03-29 LAB — CBC
HCT: 45.1 % (ref 39.0–52.0)
Hemoglobin: 16.2 g/dL (ref 13.0–17.0)
MCH: 31.2 pg (ref 26.0–34.0)
MCHC: 35.9 g/dL (ref 30.0–36.0)
MCV: 86.7 fL (ref 80.0–100.0)
Platelets: 225 10*3/uL (ref 150–400)
RBC: 5.2 MIL/uL (ref 4.22–5.81)
RDW: 12.4 % (ref 11.5–15.5)
WBC: 6.7 10*3/uL (ref 4.0–10.5)
nRBC: 0 % (ref 0.0–0.2)

## 2021-03-29 LAB — BASIC METABOLIC PANEL
Anion gap: 11 (ref 5–15)
BUN: 26 mg/dL — ABNORMAL HIGH (ref 6–20)
CO2: 28 mmol/L (ref 22–32)
Calcium: 9.3 mg/dL (ref 8.9–10.3)
Chloride: 96 mmol/L — ABNORMAL LOW (ref 98–111)
Creatinine, Ser: 1.45 mg/dL — ABNORMAL HIGH (ref 0.61–1.24)
GFR, Estimated: 57 mL/min — ABNORMAL LOW (ref >60)
Glucose, Bld: 125 mg/dL — ABNORMAL HIGH (ref 70–99)
Potassium: 3.7 mmol/L (ref 3.5–5.1)
Sodium: 135 mmol/L (ref 135–145)

## 2021-03-29 LAB — MAGNESIUM: Magnesium: 2 mg/dL (ref 1.7–2.4)

## 2021-03-29 LAB — TROPONIN I (HIGH SENSITIVITY): Troponin I (High Sensitivity): 4 ng/L (ref <18)

## 2021-03-29 MED ORDER — SODIUM CHLORIDE 0.9 % IV BOLUS
1000.0000 mL | Freq: Once | INTRAVENOUS | Status: AC
  Administered 2021-03-29: 1000 mL via INTRAVENOUS

## 2021-03-29 NOTE — ED Triage Notes (Signed)
Pt c/o intermittent palpitations where he feels his heart is racing and becomes short of breath the past few days. States becomes lightheaded and short of breath with minimal activity.

## 2021-03-29 NOTE — ED Provider Notes (Signed)
[facility] Provider Note   CSN: [id number] Arrival date & time: 03/29/21  1230     History Chief Complaint  Patient presents with   Palpitations    Austin Boyd is a 55 y.o. male.  55 yo M with a cc of palpitations.  Off and on for the past 3 days.  Patient denies any new medications denies any new over-the-counter supplements.  Has started a diet that is higher in protein and less carbohydrates.  He does have a history of thyroid disease and last check had no significant change to his thyroid studies.  He denies cough congestion or fever denies nausea vomiting or diarrhea.  Denies abdominal pain.  Tells me that he just gets a sensation like his heart is beating fast and last for few minutes and then goes away.  First time it happened he was about to drink some coffee and so he thought that he needed to drink the coffee to make it better.  Did seem to resolve the symptoms at that time.  He does say that he has had a little bit of a dry cough for a long time that is unchanged.  The history is provided by the patient and the spouse.  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   Hypertension    NAFLD (nonalcoholic fatty liver disease)    [place] 2016   PONV (postoperative nausea and vomiting)    Thyroid disease    Wears contact lenses     Patient Active Problem List   Diagnosis Date Noted   NAFLD (nonalcoholic fatty liver disease)    Greater trochanteric bursitis of  right hip 10/18/2017   Stiffness of finger joint of left hand 07/22/2017   Pain in finger of left hand 07/05/2017   Crushing injury of finger of left hand 07/05/2017   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   GERD (gastroesophageal reflux disease) 08/05/2015   Fatty liver 10/23/2014   Preventative health care 10/15/2014   Essential hypertension 04/12/2014   Hypothyroidism 44/81/8563   Umbilical hernia 14/97/0263    Past Surgical History:  Procedure Laterality Date   INSERTION OF MESH N/A 06/13/2014   Procedure: INSERTION OF MESH;  Surgeon: Donnie Mesa, MD;  Location: [facility];  Service: General;  Laterality: N/A;   UMBILICAL HERNIA REPAIR N/A 06/13/2014   Procedure: HERNIA REPAIR UMBILICAL ADULT;  Surgeon: Donnie Mesa, MD;  Location: [facility];  Service: General;  Laterality: N/A;   WISDOM TOOTH EXTRACTION         Family History  Problem Relation Age of Onset   Arthritis Mother    Alcohol abuse Father    Multiple sclerosis Father        died in late 43's   Cancer Brother 92       Thyroid    Heart disease Maternal Grandmother    Stroke Maternal Grandmother    Hypertension Maternal Grandmother    Heart disease Maternal Grandfather    Stroke  Maternal Grandfather    Hypertension Maternal Grandfather    Hyperlipidemia Paternal Grandmother    Hyperlipidemia Paternal Grandfather    Colon cancer Neg Hx     Social History   Tobacco Use   Smoking status: Never   Smokeless tobacco: Never  Substance Use Topics   Alcohol use: Yes    Alcohol/week: 42.0 standard drinks    Types: 42 Cans of beer per week   Drug use: No    Home Medications Prior to Admission medications   Medication Sig Start Date End Date Taking? Authorizing Provider  amLODipine-valsartan (EXFORGE) 5-320 MG tablet TAKE 1 TABLET BY MOUTH EVERY DAY 01/08/21   Shelda Pal, DO  chlorthalidone (HYGROTON) 25 MG tablet TAKE 1 TABLET BY MOUTH EVERY DAY 03/25/21    Wendling, Crosby Oyster, DO  cyclopentolate (CYCLODRYL,CYCLOGYL) 1 % ophthalmic solution INSTILL 1 DROP INTO LEFT EYE THREE TIMES A DAY 12/14/18   [provider]  diclofenac (VOLTAREN) 75 MG EC tablet TAKE 1 TABLET BY MOUTH TWICE A DAY 08/14/19   Regal, Tamala Fothergill, DPM  DUREZOL 0.05 % EMUL INSTILL 1 DROP INTO LEFT EYE EVERY 2 HOURS X1DAY, 4 TIMES DAILY X1WEEK, TWICE A DAY X 1 WEEK 12/14/18   [provider]  famotidine (PEPCID) 20 MG tablet TAKE 1 TABLET BY MOUTH TWICE A DAY 01/04/19   Shelda Pal, DO  levothyroxine (SYNTHROID) 75 MCG tablet TAKE 1 TABLET BY MOUTH EVERY DAY 12/23/20   Shelda Pal, DO  sildenafil (VIAGRA) 100 MG tablet Take 1 tab daily as needed for erectile dysfunction. 10/11/20   Shelda Pal, DO  spironolactone (ALDACTONE) 25 MG tablet TAKE 1 TABLET BY MOUTH EVERY DAY 12/24/20   Shelda Pal, DO    Allergies    Patient has no known allergies.  Review of Systems   Review of Systems  Constitutional:  Negative for chills and fever.  HENT:  Negative for congestion and facial swelling.   Eyes:  Negative for discharge and visual disturbance.  Respiratory:  Negative for shortness of breath.   Cardiovascular:  Positive for palpitations. Negative for chest pain.  Gastrointestinal:  Negative for abdominal pain, diarrhea and vomiting.  Musculoskeletal:  Negative for arthralgias and myalgias.  Skin:  Negative for color change and rash.  Neurological:  Negative for tremors, syncope and headaches.  Psychiatric/Behavioral:  Negative for confusion and dysphoric mood.    Physical Exam Updated Vital Signs BP 127/85   Pulse 83   Temp 98.2 F (36.8 C) (Oral)   Resp (!) 26   Ht 5\' 6"  (1.676 m)   Wt 83 kg   SpO2 97%   BMI 29.54 kg/m   Physical Exam Vitals and nursing note reviewed.  Constitutional:      Appearance: He is well-developed.  HENT:     Head: Normocephalic and atraumatic.  Eyes:     Pupils: Pupils are equal,  round, and reactive to light.  Neck:     Vascular: No JVD.  Cardiovascular:     Rate and Rhythm: Normal rate and regular rhythm.     Heart sounds: No murmur heard.   No friction rub. No gallop.  Pulmonary:     Effort: No respiratory distress.     Breath sounds: No wheezing.  Abdominal:     General: There is no distension.     Tenderness: There is no abdominal tenderness. There is no guarding or rebound.  Musculoskeletal:        General: Normal range  of motion.     Cervical back: Normal range of motion and neck supple.  Skin:    Coloration: Skin is not pale.     Findings: No rash.  Neurological:     Mental Status: He is alert and oriented to person, place, and time.  Psychiatric:        Behavior: Behavior normal.    ED Results / Procedures / Treatments   Labs (all labs ordered are listed, but only abnormal results are displayed) Labs Reviewed  BASIC METABOLIC PANEL - Abnormal; Notable for the following components:      Result Value   Chloride 96 (*)    Glucose, Bld 125 (*)    BUN 26 (*)    Creatinine, Ser 1.45 (*)    GFR, Estimated 57 (*)    All other components within normal limits  CBC  MAGNESIUM  TROPONIN I (HIGH SENSITIVITY)    EKG EKG Interpretation  Date/Time:  Saturday March 29 2021 12:42:25 EDT Ventricular Rate:  85 PR Interval:  143 QRS Duration: 108 QT Interval:  362 QTC Calculation: 431 R Axis:   -51 Text Interpretation: Sinus rhythm Left anterior fascicular block Abnormal R-wave progression, late transition no wpw, prolonged qt or brugada. Otherwise no significant change Confirmed by Deno Etienne [phone]) on 03/29/2021 1:08:29 PM  Radiology DG Chest 2 View  Result Date: 03/29/2021 CLINICAL DATA:  Shortness of breath, palpitations EXAM: CHEST - 2 VIEW COMPARISON:  03/24/2016 FINDINGS: The heart size and mediastinal contours are within normal limits. No focal airspace consolidation, pleural effusion, or pneumothorax. The visualized skeletal structures  are unremarkable. IMPRESSION: No active cardiopulmonary disease. Electronically Signed   By: Davina Poke D.O.   On: 03/29/2021 13:39    Procedures .1-3 Lead EKG Interpretation Performed by: Deno Etienne, DO Authorized by: Deno Etienne, DO     Interpretation: normal     ECG rate:  83   ECG rate assessment: normal     Rhythm: sinus rhythm     Ectopy: none     Conduction: normal     Medications Ordered in ED Medications  sodium chloride 0.9 % bolus 1,000 mL (1,000 mLs Intravenous New Bag/Given 03/29/21 1257)    ED Course  I have reviewed the triage vital signs and the nursing notes.  Pertinent labs & imaging results that were available during my care of the patient were reviewed by me and considered in my medical decision making (see chart for details).    MDM Rules/Calculators/A&P                           55 yo M with a cc of heart palpitations.  Off and on for past three days.  Have had it happen in the past and resolved spontaneously.    Will obtain electrolytes chest x-ray reassess.  Electrolytes are unremarkable, no anemia troponin negative.  No ectopy noted on the cardiac monitor.  Will discharge home.  PCP follow-up.  2:12 PM:  I have discussed the diagnosis/risks/treatment options with the patient and believe the pt to be eligible for discharge home to follow-up with PCP. We also discussed returning to the ED immediately if new or worsening sx occur. We discussed the sx which are most concerning (e.g., sudden worsening pain, fever, inability to tolerate by mouth) that necessitate immediate return. Medications administered to the patient during their visit and any new prescriptions provided to the patient are listed below.  Medications given during  this visit Medications  sodium chloride 0.9 % bolus 1,000 mL (1,000 mLs Intravenous New Bag/Given 03/29/21 1257)     The patient appears reasonably screen and/or stabilized for discharge and I doubt any other medical  condition or other [facility] requiring further screening, evaluation, or treatment in the ED at this time prior to discharge.    Final Clinical Impression(s) / ED Diagnoses Final diagnoses:  Palpitations    Rx / DC Orders ED Discharge Orders     None        Deno Etienne, DO 03/29/21 1412

## 2021-03-29 NOTE — Discharge Instructions (Signed)
Follow up with your family doc.  Return for worsening or persistent symptoms.

## 2021-03-30 ENCOUNTER — Other Ambulatory Visit: Payer: Self-pay | Admitting: Family Medicine

## 2021-03-30 DIAGNOSIS — I10 Essential (primary) hypertension: Secondary | ICD-10-CM

## 2021-04-14 ENCOUNTER — Other Ambulatory Visit: Payer: Self-pay

## 2021-04-15 ENCOUNTER — Encounter: Payer: Self-pay | Admitting: Family Medicine

## 2021-04-15 ENCOUNTER — Ambulatory Visit (INDEPENDENT_AMBULATORY_CARE_PROVIDER_SITE_OTHER): Payer: BC Managed Care – PPO | Admitting: Family Medicine

## 2021-04-15 VITALS — BP 120/72 | HR 86 | Temp 99.2°F | Ht 66.0 in | Wt 183.2 lb

## 2021-04-15 DIAGNOSIS — Z0000 Encounter for general adult medical examination without abnormal findings: Secondary | ICD-10-CM | POA: Diagnosis not present

## 2021-04-15 DIAGNOSIS — Z23 Encounter for immunization: Secondary | ICD-10-CM

## 2021-04-15 DIAGNOSIS — Z125 Encounter for screening for malignant neoplasm of prostate: Secondary | ICD-10-CM

## 2021-04-15 NOTE — Patient Instructions (Addendum)
Keep the diet clean and stay active.  Give [place] 2-3 business days to get the results of your labs back.   The new Shingrix vaccine (for shingles) is a 2 shot series. It can make people feel low energy, achy and almost like they have the flu for 48 hours after injection. Please plan accordingly when deciding on when to get this shot. Call our office for a nurse visit appointment to get this. The second shot of the series is less severe regarding the side effects, but it still lasts 48 hours.   As you continue to lose weight, monitor your blood pressure around 1-2 times per week. If you're consistently <110/70, send me a message.   Let us know if you need anything.

## 2021-04-15 NOTE — Progress Notes (Signed)
Chief Complaint  Patient presents with   Annual Exam    Well Male Austin Boyd is here for a complete physical.   His last physical was >1 year ago.  Current diet: in general, a "healthy" diet.  Current exercise: lifting weights, some walking Weight trend: stable Fatigue out of ordinary? No. Seat belt? Yes.    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   Hypertension    NAFLD (nonalcoholic fatty liver disease)    [place] 2016   PONV (postoperative nausea and vomiting)    Thyroid disease    Wears contact lenses     Past Surgical History:  Procedure Laterality Date   INSERTION OF MESH N/A 06/13/2014   Procedure: INSERTION OF MESH;  Surgeon: Donnie Mesa, MD;  Location: [facility];  Service: General;  Laterality: N/A;   UMBILICAL HERNIA REPAIR N/A 06/13/2014   Procedure: HERNIA REPAIR UMBILICAL ADULT;  Surgeon: Donnie Mesa, MD;  Location: [facility];  Service: General;  Laterality: N/A;   WISDOM TOOTH EXTRACTION      Medications  Current Outpatient Medications on File Prior to Visit  Medication Sig Dispense Refill   amLODipine-valsartan (EXFORGE) 5-320 MG tablet TAKE 1 TABLET BY MOUTH EVERY DAY 90 tablet 1   chlorthalidone (HYGROTON) 25 MG tablet TAKE 1 TABLET BY MOUTH EVERY DAY 90 tablet 0   cyclopentolate (CYCLODRYL,CYCLOGYL) 1 % ophthalmic solution INSTILL 1 DROP INTO LEFT EYE THREE TIMES A DAY     diclofenac (VOLTAREN) 75 MG EC tablet TAKE 1 TABLET BY MOUTH TWICE A DAY 50 tablet 2   DUREZOL 0.05 % EMUL INSTILL 1 DROP INTO LEFT EYE EVERY 2 HOURS X1DAY, 4 TIMES DAILY X1WEEK, TWICE A DAY X 1 WEEK     famotidine (PEPCID) 20 MG tablet TAKE 1 TABLET BY MOUTH TWICE A DAY 180 tablet 1   levothyroxine  (SYNTHROID) 75 MCG tablet TAKE 1 TABLET BY MOUTH EVERY DAY 90 tablet 0   sildenafil (VIAGRA) 100 MG tablet Take 1 tab daily as needed for erectile dysfunction. 30 tablet 2   spironolactone (ALDACTONE) 25 MG tablet TAKE 1 TABLET BY MOUTH EVERY DAY 90 tablet 0   Allergies No Known Allergies  Family History Family History  Problem Relation Age of Onset   Arthritis Mother    Alcohol abuse Father    Multiple sclerosis Father        died in late 74's   Cancer Brother 60       Thyroid    Heart disease Maternal Grandmother    Stroke Maternal Grandmother    Hypertension Maternal Grandmother    Heart disease Maternal Grandfather    Stroke Maternal Grandfather    Hypertension Maternal Grandfather    Hyperlipidemia Paternal Grandmother    Hyperlipidemia Paternal Grandfather    Colon cancer Neg Hx     Review of Systems: Constitutional:  no fevers Eye:  no recent significant change in vision Ear/Nose/Mouth/Throat:  Ears:  no hearing loss Nose/Mouth/Throat:  no complaints of nasal congestion, no sore throat Cardiovascular:  no chest pain Respiratory:  no shortness of breath Gastrointestinal:  no change in bowel habits GU:  Male: negative for dysuria, frequency Musculoskeletal/Extremities:  no joint pain Integumentary (Skin/Breast):  no abnormal skin lesions reported Neurologic:  no headaches Endocrine: No unexpected weight changes Hematologic/Lymphatic:  no abnormal bleeding  Exam BP 120/72   Pulse 86   Temp 99.2 F (37.3 C) (Oral)   Ht 5\' 6"  (1.676 m)   Wt 183 lb 4 oz (83.1 kg)   SpO2 96%   BMI 29.58 kg/m  General:  well developed, well nourished, in no apparent distress Skin:  no significant moles, warts, or growths Head:  no masses, lesions, or tenderness Eyes:  pupils equal and round, sclera anicteric without injection Ears:  canals without lesions, TMs shiny without retraction, no obvious effusion, no erythema Nose:  nares patent, septum midline, mucosa  normal Throat/Pharynx:  lips and gingiva without lesion; tongue and uvula midline; non-inflamed pharynx; no exudates or postnasal drainage Neck: neck supple without adenopathy, thyromegaly, or masses Cardiac: RRR, no bruits, no LE edema Lungs:  clear to auscultation, breath sounds equal bilaterally, no respiratory distress Abdomen: BS+, soft, non-tender, non-distended, no masses or organomegaly noted Rectal: Deferred Musculoskeletal:  symmetrical muscle groups noted without atrophy or deformity Neuro:  gait normal; deep tendon reflexes normal and symmetric Psych: well oriented with normal range of affect and appropriate judgment/insight  Assessment and Plan  Well adult exam - Plan: Lipid panel, CBC, Comprehensive metabolic panel  Screening for prostate cancer - Plan: PSA  Need for influenza vaccination - Plan: Flu Vaccine QUAD 6+ mos PF IM (Fluarix Quad PF)   Well 55 y.o. male. Counseled on diet and exercise. Counseled on risks and benefits of prostate cancer screening with PSA. The patient agrees to undergo testing. Immunizations, labs, and further orders as above. Shingrix rec'd. Flu shot today.  Follow up in 6 mo. The patient voiced understanding and agreement to the plan.  [place], DO 04/15/21 3:14 PM

## 2021-04-16 ENCOUNTER — Other Ambulatory Visit: Payer: Self-pay | Admitting: Family Medicine

## 2021-04-16 DIAGNOSIS — R945 Abnormal results of liver function studies: Secondary | ICD-10-CM

## 2021-04-16 DIAGNOSIS — E785 Hyperlipidemia, unspecified: Secondary | ICD-10-CM

## 2021-04-16 LAB — LIPID PANEL
Cholesterol: 188 mg/dL (ref 0–200)
HDL: 51.7 mg/dL (ref >39.00)
NonHDL: 136.63
Total CHOL/HDL Ratio: 4
Triglycerides: 231 mg/dL — ABNORMAL HIGH (ref 0.0–149.0)
VLDL: 46.2 mg/dL — ABNORMAL HIGH (ref 0.0–40.0)

## 2021-04-16 LAB — COMPREHENSIVE METABOLIC PANEL
ALT: 57 U/L — ABNORMAL HIGH (ref 0–53)
AST: 30 U/L (ref 0–37)
Albumin: 4.5 g/dL (ref 3.5–5.2)
Alkaline Phosphatase: 55 U/L (ref 39–117)
BUN: 25 mg/dL — ABNORMAL HIGH (ref 6–23)
CO2: 31 mEq/L (ref 19–32)
Calcium: 9.9 mg/dL (ref 8.4–10.5)
Chloride: 99 mEq/L (ref 96–112)
Creatinine, Ser: 1.84 mg/dL — ABNORMAL HIGH (ref 0.40–1.50)
GFR: 40.81 mL/min — ABNORMAL LOW (ref >60.00)
Glucose, Bld: 98 mg/dL (ref 70–99)
Potassium: 3.7 mEq/L (ref 3.5–5.1)
Sodium: 139 mEq/L (ref 135–145)
Total Bilirubin: 0.6 mg/dL (ref 0.2–1.2)
Total Protein: 7 g/dL (ref 6.0–8.3)

## 2021-04-16 LAB — CBC
HCT: 41.3 % (ref 39.0–52.0)
Hemoglobin: 14.4 g/dL (ref 13.0–17.0)
MCHC: 34.7 g/dL (ref 30.0–36.0)
MCV: 89.4 fl (ref 78.0–100.0)
Platelets: 251 10*3/uL (ref 150.0–400.0)
RBC: 4.63 Mil/uL (ref 4.22–5.81)
RDW: 12.9 % (ref 11.5–15.5)
WBC: 6.1 10*3/uL (ref 4.0–10.5)

## 2021-04-16 LAB — PSA: PSA: 2.09 ng/mL (ref 0.10–4.00)

## 2021-04-16 LAB — LDL CHOLESTEROL, DIRECT: Direct LDL: 114 mg/dL

## 2021-04-30 ENCOUNTER — Other Ambulatory Visit: Payer: BC Managed Care – PPO

## 2021-04-30 ENCOUNTER — Other Ambulatory Visit (INDEPENDENT_AMBULATORY_CARE_PROVIDER_SITE_OTHER): Payer: BC Managed Care – PPO

## 2021-04-30 ENCOUNTER — Other Ambulatory Visit: Payer: Self-pay | Admitting: Family Medicine

## 2021-04-30 ENCOUNTER — Other Ambulatory Visit: Payer: Self-pay

## 2021-04-30 DIAGNOSIS — E785 Hyperlipidemia, unspecified: Secondary | ICD-10-CM

## 2021-04-30 DIAGNOSIS — R945 Abnormal results of liver function studies: Secondary | ICD-10-CM

## 2021-04-30 LAB — COMPREHENSIVE METABOLIC PANEL
ALT: 55 U/L — ABNORMAL HIGH (ref 0–53)
AST: 29 U/L (ref 0–37)
Albumin: 4.6 g/dL (ref 3.5–5.2)
Alkaline Phosphatase: 47 U/L (ref 39–117)
BUN: 28 mg/dL — ABNORMAL HIGH (ref 6–23)
CO2: 31 mEq/L (ref 19–32)
Calcium: 9.6 mg/dL (ref 8.4–10.5)
Chloride: 100 mEq/L (ref 96–112)
Creatinine, Ser: 1.29 mg/dL (ref 0.40–1.50)
GFR: 62.47 mL/min (ref >60.00)
Glucose, Bld: 98 mg/dL (ref 70–99)
Potassium: 3.8 mEq/L (ref 3.5–5.1)
Sodium: 139 mEq/L (ref 135–145)
Total Bilirubin: 0.9 mg/dL (ref 0.2–1.2)
Total Protein: 7.1 g/dL (ref 6.0–8.3)

## 2021-04-30 LAB — LIPID PANEL
Cholesterol: 182 mg/dL (ref 0–200)
HDL: 45.3 mg/dL (ref >39.00)
LDL Cholesterol: 112 mg/dL — ABNORMAL HIGH (ref 0–99)
NonHDL: 136.9
Total CHOL/HDL Ratio: 4
Triglycerides: 124 mg/dL (ref 0.0–149.0)
VLDL: 24.8 mg/dL (ref 0.0–40.0)

## 2021-06-28 ENCOUNTER — Other Ambulatory Visit: Payer: Self-pay | Admitting: Family Medicine

## 2021-06-28 DIAGNOSIS — I10 Essential (primary) hypertension: Secondary | ICD-10-CM

## 2021-07-02 ENCOUNTER — Other Ambulatory Visit: Payer: Self-pay | Admitting: Family Medicine

## 2021-07-03 ENCOUNTER — Other Ambulatory Visit: Payer: Self-pay | Admitting: Family Medicine

## 2021-07-03 DIAGNOSIS — I10 Essential (primary) hypertension: Secondary | ICD-10-CM

## 2021-07-25 ENCOUNTER — Encounter: Payer: Self-pay | Admitting: Family Medicine

## 2021-07-25 ENCOUNTER — Ambulatory Visit: Payer: BC Managed Care – PPO | Admitting: Family Medicine

## 2021-07-25 VITALS — BP 108/68 | HR 85 | Temp 98.7°F | Ht 66.0 in | Wt 189.2 lb

## 2021-07-25 DIAGNOSIS — J40 Bronchitis, not specified as acute or chronic: Secondary | ICD-10-CM | POA: Diagnosis not present

## 2021-07-25 MED ORDER — BENZONATATE 200 MG PO CAPS: 200.0000 mg | ORAL_CAPSULE | Freq: Two times a day (BID) | ORAL | 0 refills | Status: DC | PRN

## 2021-07-25 MED ORDER — PREDNISONE 20 MG PO TABS
40.0000 mg | ORAL_TABLET | Freq: Every day | ORAL | 0 refills | Status: AC
Start: 2021-07-25 — End: 2021-07-30

## 2021-07-25 MED ORDER — AZITHROMYCIN 250 MG PO TABS: ORAL_TABLET | ORAL | 0 refills | Status: DC

## 2021-07-25 NOTE — Patient Instructions (Addendum)
Continue to push fluids, practice good hand hygiene, and cover your mouth if you cough.  If you start having fevers, shaking or shortness of breath, seek immediate care.  OK to take Tylenol 1000 mg (2 extra strength tabs) or 975 mg (3 regular strength tabs) every 6 hours as needed.  Don't take the antibiotic until Sunday and only if you are not improving.   Let us know if you need anything.

## 2021-07-25 NOTE — Progress Notes (Signed)
Chief Complaint  Patient presents with   Nasal Congestion   Cough    Started on Sunday    Austin Boyd here for URI complaints.  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   Hypertension    NAFLD (nonalcoholic fatty liver disease)    [place] 2016   PONV (postoperative nausea and vomiting)    Thyroid disease    Wears contact lenses     Objective BP 108/68    Pulse 85    Temp 98.7 F (37.1 C) (Oral)    Ht 5\' 6"  (1.676 m)    Wt 189 lb 4 oz (85.8 kg)    SpO2 97%    BMI 30.55 kg/m  General: Awake, alert, appears stated age HEENT: AT, [place], ears patent b/l and TM's neg, nares patent w/o discharge, pharynx pink and without exudates, MMM Neck: No masses or asymmetry Heart: RRR Lungs: CTAB, no accessory muscle use Psych: Age appropriate judgment and insight, normal mood and affect  Wheezy bronchitis - Plan: benzonatate (TESSALON) 200 MG capsule, predniSONE (DELTASONE) 20 MG tablet, azithromycin (ZITHROMAX) 250 MG tablet  5 d pred burst 40 mg/d. Benzonatate prn. Zpak in 2 d if no better, though probably will not need. Continue to push fluids, practice good hand hygiene, cover mouth when coughing. F/u prn. If starting to experience fevers, shaking, or shortness of breath, seek immediate care. Pt voiced understanding and agreement to the plan.  [place], DO 07/25/21 10:55 AM

## 2021-07-31 ENCOUNTER — Encounter: Payer: Self-pay | Admitting: Family Medicine

## 2021-07-31 ENCOUNTER — Other Ambulatory Visit: Payer: Self-pay | Admitting: Family Medicine

## 2021-07-31 MED ORDER — DOXYCYCLINE HYCLATE 100 MG PO TABS: 100.0000 mg | ORAL_TABLET | Freq: Two times a day (BID) | ORAL | 0 refills | Status: AC

## 2021-08-08 ENCOUNTER — Other Ambulatory Visit: Payer: Self-pay | Admitting: Family Medicine

## 2021-08-08 DIAGNOSIS — I10 Essential (primary) hypertension: Secondary | ICD-10-CM

## 2021-10-03 ENCOUNTER — Other Ambulatory Visit: Payer: Self-pay | Admitting: Family Medicine

## 2021-11-09 ENCOUNTER — Other Ambulatory Visit: Payer: Self-pay | Admitting: Family Medicine

## 2021-11-11 ENCOUNTER — Telehealth: Payer: Self-pay | Admitting: Family Medicine

## 2021-11-11 NOTE — Telephone Encounter (Signed)
INITIATE PA FOR SILDENAFIL [place] KEY:  [id number] Waiting response from Covermymeds.

## 2021-11-11 NOTE — Telephone Encounter (Signed)
Received approval

## 2022-01-03 ENCOUNTER — Other Ambulatory Visit: Payer: Self-pay | Admitting: Family Medicine

## 2022-01-03 DIAGNOSIS — I10 Essential (primary) hypertension: Secondary | ICD-10-CM

## 2022-02-07 ENCOUNTER — Other Ambulatory Visit: Payer: Self-pay | Admitting: Family Medicine

## 2022-02-07 DIAGNOSIS — I10 Essential (primary) hypertension: Secondary | ICD-10-CM

## 2022-03-27 ENCOUNTER — Other Ambulatory Visit: Payer: Self-pay | Admitting: Family Medicine

## 2022-06-24 ENCOUNTER — Other Ambulatory Visit: Payer: Self-pay | Admitting: Family Medicine

## 2022-06-24 DIAGNOSIS — I10 Essential (primary) hypertension: Secondary | ICD-10-CM

## 2022-07-15 ENCOUNTER — Other Ambulatory Visit: Payer: Self-pay | Admitting: Family Medicine

## 2022-07-15 DIAGNOSIS — I10 Essential (primary) hypertension: Secondary | ICD-10-CM

## 2022-08-28 ENCOUNTER — Other Ambulatory Visit: Payer: Self-pay | Admitting: Family Medicine

## 2022-08-28 DIAGNOSIS — I10 Essential (primary) hypertension: Secondary | ICD-10-CM

## 2022-09-09 ENCOUNTER — Other Ambulatory Visit: Payer: Self-pay | Admitting: Family Medicine

## 2022-10-18 ENCOUNTER — Other Ambulatory Visit: Payer: Self-pay | Admitting: Family Medicine

## 2022-10-18 DIAGNOSIS — I10 Essential (primary) hypertension: Secondary | ICD-10-CM

## 2023-01-27 ENCOUNTER — Ambulatory Visit: Payer: BC Managed Care – PPO | Admitting: Family Medicine

## 2023-01-28 ENCOUNTER — Encounter (INDEPENDENT_AMBULATORY_CARE_PROVIDER_SITE_OTHER): Payer: Self-pay

## 2023-02-05 ENCOUNTER — Other Ambulatory Visit: Payer: Self-pay | Admitting: Family Medicine

## 2023-02-05 DIAGNOSIS — I10 Essential (primary) hypertension: Secondary | ICD-10-CM

## 2023-03-03 ENCOUNTER — Encounter: Payer: Self-pay | Admitting: Family Medicine

## 2023-03-03 ENCOUNTER — Ambulatory Visit (INDEPENDENT_AMBULATORY_CARE_PROVIDER_SITE_OTHER): Payer: BC Managed Care – PPO | Admitting: Family Medicine

## 2023-03-03 VITALS — BP 134/84 | HR 65 | Temp 98.4°F | Ht 66.0 in | Wt 181.1 lb

## 2023-03-03 DIAGNOSIS — Z0000 Encounter for general adult medical examination without abnormal findings: Secondary | ICD-10-CM | POA: Diagnosis not present

## 2023-03-03 DIAGNOSIS — I10 Essential (primary) hypertension: Secondary | ICD-10-CM | POA: Diagnosis not present

## 2023-03-03 DIAGNOSIS — Z125 Encounter for screening for malignant neoplasm of prostate: Secondary | ICD-10-CM | POA: Diagnosis not present

## 2023-03-03 DIAGNOSIS — E039 Hypothyroidism, unspecified: Secondary | ICD-10-CM | POA: Diagnosis not present

## 2023-03-03 MED ORDER — AMLODIPINE BESYLATE-VALSARTAN 5-320 MG PO TABS: 1.0000 | ORAL_TABLET | Freq: Every day | ORAL | 2 refills | Status: DC

## 2023-03-03 MED ORDER — CHLORTHALIDONE 25 MG PO TABS
25.0000 mg | ORAL_TABLET | Freq: Every day | ORAL | 2 refills | Status: DC
Start: 2023-03-03 — End: 2023-10-25

## 2023-03-03 NOTE — Progress Notes (Signed)
Chief Complaint  Patient presents with   Annual Exam    Well Male Austin Boyd is here for a complete physical.   His last physical was >1 year ago.  Current diet: in general, a "healthy" diet.  Current exercise: lifting wts Weight trend: decreased  Fatigue out of ordinary? No. Seat belt? Yes.   Advanced directive? 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   Hypertension    NAFLD (nonalcoholic fatty liver disease)    [place] 2016   PONV (postoperative nausea and vomiting)    Thyroid disease    Wears contact lenses       Past Surgical History:  Procedure Laterality Date   INSERTION OF MESH N/A 06/13/2014   Procedure: INSERTION OF MESH;  Surgeon: Manus Rudd, MD;  Location: [facility];  Service: General;  Laterality: N/A;   UMBILICAL HERNIA REPAIR N/A 06/13/2014   Procedure: HERNIA REPAIR UMBILICAL ADULT;  Surgeon: Manus Rudd, MD;  Location: [facility];  Service: General;  Laterality: N/A;   WISDOM TOOTH EXTRACTION      Medications  Current Outpatient Medications on File Prior to Visit  Medication Sig Dispense Refill   amLODipine-valsartan (EXFORGE) 5-320 MG tablet TAKE 1 TABLET BY MOUTH EVERY DAY 90 tablet 1   chlorthalidone (HYGROTON) 25 MG tablet TAKE 1 TABLET BY MOUTH EVERY DAY 90 tablet 1   famotidine (PEPCID) 20 MG tablet TAKE 1 TABLET BY MOUTH TWICE A DAY 180 tablet 1   levothyroxine (SYNTHROID) 75 MCG tablet TAKE 1 TABLET BY MOUTH EVERY DAY 90 tablet 0   sildenafil (VIAGRA) 100 MG tablet TAKE 1 TAB DAILY AS NEEDED FOR ERECTILE DYSFUNCTION. 8 tablet 11   spironolactone (ALDACTONE) 25 MG tablet TAKE 1 TABLET BY MOUTH EVERY DAY 90 tablet 1   Allergies No Known Allergies  Family  History Family History  Problem Relation Age of Onset   Arthritis Mother    Alcohol abuse Father    Multiple sclerosis Father        died in late 21's   Cancer Brother 72       Thyroid    Heart disease Maternal Grandmother    Stroke Maternal Grandmother    Hypertension Maternal Grandmother    Heart disease Maternal Grandfather    Stroke Maternal Grandfather    Hypertension Maternal Grandfather    Hyperlipidemia Paternal Grandmother    Hyperlipidemia Paternal Grandfather    Colon cancer Neg Hx     Review of Systems: Constitutional:  no fevers Eye:  no recent significant change in vision Ear/Nose/Mouth/Throat:  Ears:  no hearing loss Nose/Mouth/Throat:  no complaints of nasal congestion, no sore throat Cardiovascular:  no chest pain Respiratory:  no shortness of breath Gastrointestinal:  no change in bowel habits GU:  Male: negative for dysuria, frequency Musculoskeletal/Extremities:  no joint pain Integumentary (Skin/Breast):  no abnormal skin lesions reported Neurologic:  no headaches Endocrine: No unexpected weight changes Hematologic/Lymphatic:  no abnormal bleeding  Exam BP 134/84 (BP Location: Left Arm, Cuff Size: Normal)   Pulse 65   Temp 98.4 F (36.9 C) (Oral)   Ht 5\' 6"  (1.676 m)   Wt 181 lb 2 oz (82.2 kg)   SpO2 96%  BMI 29.23 kg/m  General:  well developed, well nourished, in no apparent distress Skin:  no significant moles, warts, or growths Head:  no masses, lesions, or tenderness Eyes:  pupils equal and round, sclera anicteric without injection Ears:  canals without lesions, TMs shiny without retraction, no obvious effusion, no erythema Nose:  nares patent, mucosa normal Throat/Pharynx:  lips and gingiva without lesion; tongue and uvula midline; non-inflamed pharynx; no exudates or postnasal drainage Neck: neck supple without adenopathy, thyromegaly, or masses Cardiac: RRR, no bruits, no LE edema Lungs:  clear to auscultation, breath sounds equal  bilaterally, no respiratory distress Abdomen: BS+, soft, non-tender, non-distended, no masses or organomegaly noted Rectal: Deferred Musculoskeletal: +TTP over coracoid process on L; +Speed's, empty can; neg Neer's, cross over, [place], lift off; symmetrical muscle groups noted without atrophy or deformity Neuro:  gait normal; deep tendon reflexes normal and symmetric Psych: well oriented with normal range of affect and appropriate judgment/insight  Assessment and Plan  Well adult exam - Plan: CBC, Comprehensive metabolic panel, Lipid panel  Screening for prostate cancer - Plan: PSA  Hypothyroidism, unspecified type - Plan: TSH, T4, free  Essential hypertension - Plan: amLODipine-valsartan (EXFORGE) 5-320 MG tablet, chlorthalidone (HYGROTON) 25 MG tablet   Well 57 y.o. male. Counseled on diet and exercise. Counseled on risks and benefits of prostate cancer screening with PSA. The patient agrees to undergo testing. Advanced directive form provided today.  Shingrix rec'd.  Suspect Biceps tendonopathy. Heat, ice, Tylenol, stretches/exercises. Refer sports med if no better in 1 mo.  Immunizations, labs, and further orders as above. Follow up in 6 mo. The patient voiced understanding and agreement to the plan.  Jilda Roche [place], DO 03/03/23 1:34 PM

## 2023-03-03 NOTE — Patient Instructions (Addendum)
Give [place] 2-3 business days to get the results of your labs back.   Keep the diet clean and stay active.  I recommend getting the flu shot in mid October. This suggestion would change if the CDC comes out with a different recommendation.   Please get me a copy of your advanced directive form at your convenience.   The Shingrix vaccine (for shingles) is a 2 shot series spaced 2-6 months apart. It can make people feel low energy, achy and almost like they have the flu for 48 hours after injection. 1/5 people can have nausea and/or vomiting. Please plan accordingly when deciding on when to get this shot. Call our office for a nurse visit appointment to get this. The second shot of the series is less severe regarding the side effects, but it still lasts 48 hours.   Heat (pad or rice pillow in microwave) over affected area, 10-15 minutes twice daily.   Ice/cold pack over area for 10-15 min twice daily.  OK to take Tylenol 1000 mg (2 extra strength tabs) or 975 mg (3 regular strength tabs) every 6 hours as needed.  Send me a message in one month if no better.   Let us know if you need anything.  Biceps Tendon Disruption (Proximal) Rehab Do exercises exactly as told by your health care provider and adjust them as directed. It is normal to feel mild stretching, pulling, tightness, or discomfort as you do these exercises, but you should stop right away if you feel sudden pain or your pain gets worse.  Stretching and range of motion exercises These exercises warm up your muscles and joints and improve the movement and flexibility of your arm and shoulder. These exercises also help to relieve pain and stiffness. Exercise A: Shoulder flexion, standing   Stand facing a wall. Put your left / right hand on the wall. Slide your left / right hand up the wall. Stop when you feel a stretch in your shoulder, or when you reach the angle recommended by your health care provider. Use your other hand to help raise  your arm, if needed. As your hand gets higher, you may need to step closer to the wall. Avoid shrugging your shoulder while you raise your arm. To do this, keep your shoulder blade tucked down toward your spine. Hold for 30 seconds. Slowly return to the starting position. Use your other arm to help, if needed. Repeat 2 times. Complete this exercise 3 times per week. Exercise B: Pendulum   Stand near a wall or a surface that you can hold onto for balance. Bend at the waist and let your left / right arm hang straight down. Use your other arm to support you. Relax your arm and shoulder muscles, and move your hips and your trunk so your left / right arm swings freely. Your arm should swing because of the motion of your body, not because you are using your arm or shoulder muscles. Keep moving so your arm swings in the following directions, as told by your health care provider: Side to side. Forward and backward. In clockwise and counterclockwise circles. Slowly return to the starting position. Repeat 2 times. Complete this exercise 3 times per week.  Strengthening exercises These exercises build strength and endurance in your arm and shoulder. Endurance is the ability to use your muscles for a long time, even after your muscles get tired. Exercise C: Elbow flexion, neutral  Sit on a stable chair without armrests, or stand.  Hold a 3-5 lb weight in your left / right hand, or hold an exercise band with both hands. Your palms should face each other at the starting position. Bend your left / right elbow and move your hand up toward your shoulder. Lead with your thumb, and keep your palm facing the same direction. Keep your other arm straight down, in the starting position. Slowly return to the starting position. Repeat 2-3 times. Complete this exercise 3 times per week. Exercise D: Forearm supination   Sit with your left / right forearm on a table. Your elbow should be below shoulder height. Rest  your hand over the edge of the table so your palm faces down. If directed, hold a hammer with your left / right hand. Without moving your elbow, slowly rotate your hand so your palm faces up toward the ceiling. If you are holding a hammer, begin by holding the hammer near the head. When this exercise gets easier for you, hold the hammer farther down the handle. Hold for 3 seconds. Slowly return to the starting position. Repeat 2 times. Complete this exercise 3 times per week. Exercise E: Scapular retraction   Sit in a stable chair without armrests, or stand. Secure an exercise band to a stable object in front of you so the band is at shoulder height. Hold one end of the exercise band in each hand. Squeeze your shoulder blades together and move your elbows slightly behind you. Do not shrug your shoulders. Hold for 3 seconds. Slowly return to the starting position. Repeat 2 times. Complete this exercise 3 times per week. Exercise F: Scapular protraction, supine   Lie on your back on a firm surface. Hold a 3-5 lb weight in your left / right hand. Raise your left / right arm straight into the air so your hand is directly above your shoulder joint. Push the weight into the air so your shoulder lifts off of the surface that you are lying on. Do not move your head, neck, or back. Hold for 3 seconds. Slowly return to the starting position. Let your muscles relax completely before you repeat this exercise. Repeat 2 times. Complete this exercise 3 times per week. This information is not intended to replace advice given to you by your health care provider. Make sure you discuss any questions you have with your health care provider. Document Released: 06/01/2005 Document Revised: 02/06/2016 Document Reviewed: 05/10/2015 Elsevier Interactive Patient Education  2017 [employer].

## 2023-03-04 LAB — COMPREHENSIVE METABOLIC PANEL
ALT: 66 U/L — ABNORMAL HIGH (ref 0–53)
AST: 39 U/L — ABNORMAL HIGH (ref 0–37)
Albumin: 4.3 g/dL (ref 3.5–5.2)
Alkaline Phosphatase: 58 U/L (ref 39–117)
BUN: 17 mg/dL (ref 6–23)
CO2: 29 mEq/L (ref 19–32)
Calcium: 9.5 mg/dL (ref 8.4–10.5)
Chloride: 104 mEq/L (ref 96–112)
Creatinine, Ser: 1.18 mg/dL (ref 0.40–1.50)
GFR: 68.63 mL/min (ref >60.00)
Glucose, Bld: 103 mg/dL — ABNORMAL HIGH (ref 70–99)
Potassium: 4.2 mEq/L (ref 3.5–5.1)
Sodium: 142 mEq/L (ref 135–145)
Total Bilirubin: 0.6 mg/dL (ref 0.2–1.2)
Total Protein: 7.1 g/dL (ref 6.0–8.3)

## 2023-03-04 LAB — CBC
HCT: 44.5 % (ref 39.0–52.0)
Hemoglobin: 15.2 g/dL (ref 13.0–17.0)
MCHC: 34.1 g/dL (ref 30.0–36.0)
MCV: 90.1 fl (ref 78.0–100.0)
Platelets: 251 10*3/uL (ref 150.0–400.0)
RBC: 4.94 Mil/uL (ref 4.22–5.81)
RDW: 13 % (ref 11.5–15.5)
WBC: 5.1 10*3/uL (ref 4.0–10.5)

## 2023-03-04 LAB — LIPID PANEL
Cholesterol: 187 mg/dL (ref 0–200)
HDL: 60.4 mg/dL (ref >39.00)
LDL Cholesterol: 107 mg/dL — ABNORMAL HIGH (ref 0–99)
NonHDL: 126.17
Total CHOL/HDL Ratio: 3
Triglycerides: 94 mg/dL (ref 0.0–149.0)
VLDL: 18.8 mg/dL (ref 0.0–40.0)

## 2023-03-04 LAB — T4, FREE: Free T4: 1.03 ng/dL (ref 0.60–1.60)

## 2023-03-04 LAB — TSH: TSH: 1.38 u[IU]/mL (ref 0.35–5.50)

## 2023-03-04 LAB — PSA: PSA: 2.57 ng/mL (ref 0.10–4.00)

## 2023-03-25 ENCOUNTER — Other Ambulatory Visit: Payer: Self-pay | Admitting: Family Medicine

## 2023-06-26 ENCOUNTER — Other Ambulatory Visit: Payer: Self-pay | Admitting: Family Medicine

## 2023-06-30 IMAGING — DX DG CHEST 2V
2 series · 2 of 2 positions shown · non-contrast
Comparison: 03/24/2016

CLINICAL DATA: Shortness of breath, palpitations

EXAM:
CHEST - 2 VIEW

[chest pa]
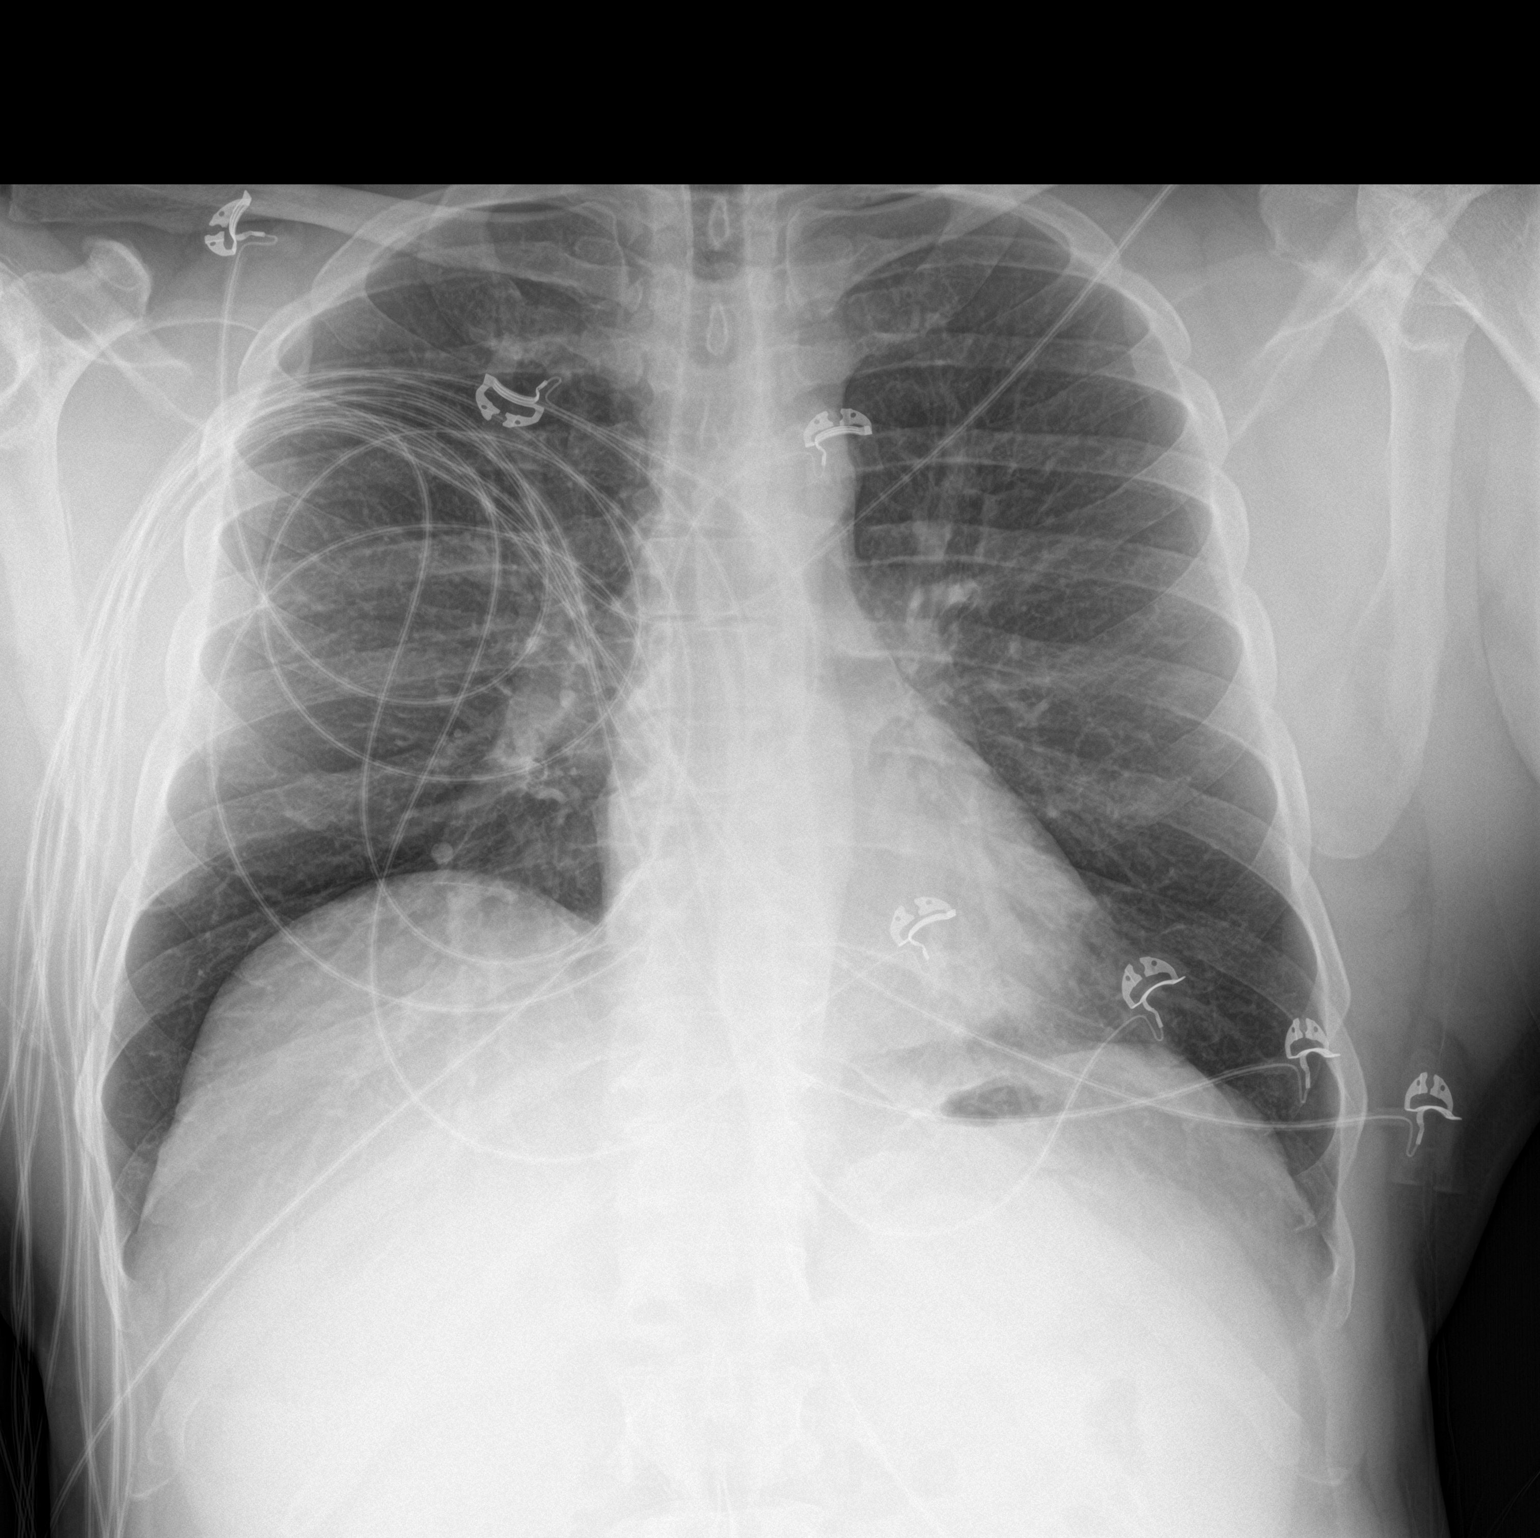

[chest lat]
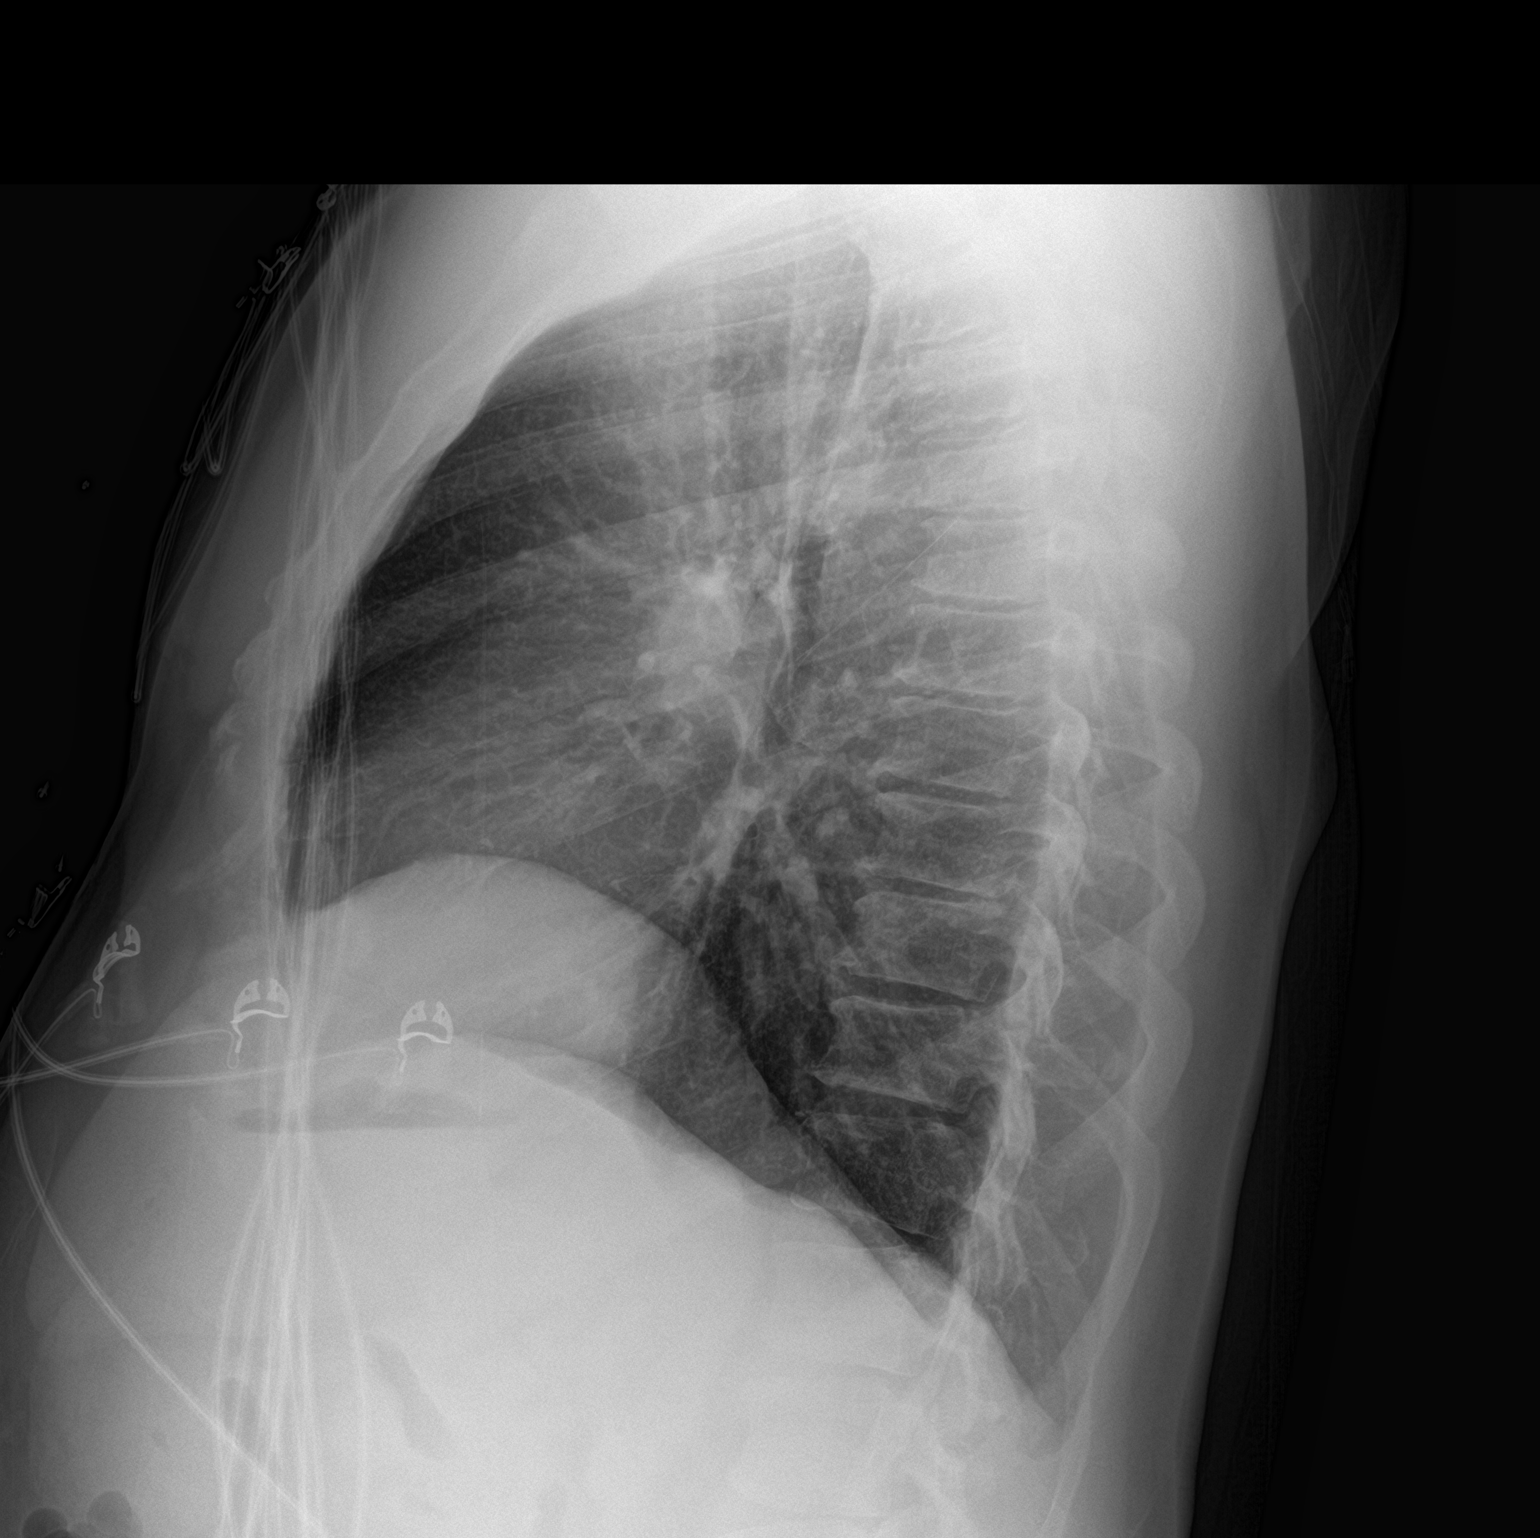

[2 of 2 positions shown; findings below may reference images not displayed]

FINDINGS: The heart size and mediastinal contours are within normal limits. No
focal airspace consolidation, pleural effusion, or pneumothorax. The
visualized skeletal structures are unremarkable.
IMPRESSION: No active cardiopulmonary disease.

## 2023-07-03 ENCOUNTER — Other Ambulatory Visit: Payer: Self-pay | Admitting: Family Medicine

## 2023-07-03 DIAGNOSIS — I10 Essential (primary) hypertension: Secondary | ICD-10-CM

## 2023-07-05 ENCOUNTER — Telehealth: Payer: Self-pay

## 2023-07-05 NOTE — Telephone Encounter (Signed)
PA initiated via Covermymeds; KEY: [id number].   PA approved.   This request has been approved using information available on the patient's profile. [id number]:[id number]:[id number];Review Type:Prior Auth;Coverage Start Date:06/05/2023;Coverage End Date:07/04/2024;

## 2023-07-11 ENCOUNTER — Other Ambulatory Visit: Payer: Self-pay | Admitting: Family Medicine

## 2023-10-20 ENCOUNTER — Encounter: Payer: Self-pay | Admitting: Family Medicine

## 2023-10-23 ENCOUNTER — Other Ambulatory Visit: Payer: Self-pay | Admitting: Family Medicine

## 2023-10-23 DIAGNOSIS — I10 Essential (primary) hypertension: Secondary | ICD-10-CM

## 2023-10-25 ENCOUNTER — Ambulatory Visit: Admitting: Family Medicine

## 2023-10-25 ENCOUNTER — Encounter: Payer: Self-pay | Admitting: Family Medicine

## 2023-10-25 VITALS — BP 128/82 | HR 76 | Temp 98.0°F | Resp 16 | Ht 66.0 in | Wt 189.9 lb

## 2023-10-25 DIAGNOSIS — M25551 Pain in right hip: Secondary | ICD-10-CM | POA: Diagnosis not present

## 2023-10-25 NOTE — Patient Instructions (Addendum)
 Heat (pad or rice pillow in microwave) over affected area, 10-15 minutes twice daily.   Ice/cold pack over area for 10-15 min twice daily.  OK to take Tylenol  1000 mg (2 extra strength tabs) or 975 mg (3 regular strength tabs) every 6 hours as needed.  Ask your insurance rep if an X-ray ordered at the [facility] in [place] is going to be covered and if the facility/hospital fee (because the imaging center is attached to the ER) is covered.   Let us  know if you need anything.  Hip Exercises It is normal to feel mild stretching, pulling, tightness, or discomfort as you do these exercises, but you should stop right away if you feel sudden pain or your pain gets worse.   STRETCHING AND RANGE OF MOTION EXERCISES These exercises warm up your muscles and joints and improve the movement and flexibility of your hip. These exercises also help to relieve pain, numbness, and tingling. Exercise A: Hamstrings, Supine  Lie on your back. Loop a belt or towel over the ball of your left / right foot. The ball of your foot is on the walking surface, right under your toes. Straighten your left / right knee and slowly pull on the belt to raise your leg. Do not let your left / right knee bend while you do this. Keep your other leg flat on the floor. Raise the left / right leg until you feel a gentle stretch behind your left / right knee or thigh. Hold this position for 30 seconds. Slowly return your leg to the starting position. Repeat2 times. Complete this stretch 3 times per week. Exercise B: Hip Rotators  Lie on your back on a firm surface. Hold your left / right knee with your left / right hand. Hold your ankle with your other hand. Gently pull your left / right knee and rotate your lower leg toward your other shoulder. Pull until you feel a stretch in your buttocks. Keep your hips and shoulders firmly planted while you do this stretch. Hold this position for 30 seconds. Repeat 2 times. Complete  this stretch 3 times per week. Exercise C: V-Sit (Hamstrings and Adductors)  Sit on the floor with your legs extended in a large "V" shape. Keep your knees straight during this exercise. Start with your head and chest upright, then bend at your waist to reach for your left foot (position A). You should feel a stretch in your right inner thigh. Hold this position for 30 seconds. Then slowly return to the upright position. Bend at your waist to reach forward (position B). You should feel a stretch behind both of your thighs and knees. Hold this position for 30 seconds. Then slowly return to the upright position. Bend at your waist to reach for your right foot (position C). You should feel a stretch in your left inner thigh. Hold this position for 30 seconds. Then slowly return to the upright position. Repeat A, B, and C 2 times each. Complete this stretch 3 times per week. Exercise D: Lunge (Hip Flexors)  Place your left / right knee on the floor and bend your other knee so that is directly over your ankle. You should be half-kneeling. Keep good posture with your head over your shoulders. Tighten your buttocks to point your tailbone downward. This helps your back to keep from arching too much. You should feel a gentle stretch in the front of your left / right thigh and hip. If you do not feel  any resistance, slightly slide your other foot forward and then slowly lunge forward so your knee once again lines up over your ankle. Make sure your tailbone continues to point downward. Hold this position for 30 seconds. Repeat 2 times. Complete this stretch 3 times per week.  STRENGTHENING EXERCISES These exercises build strength and endurance in your hip. Endurance is the ability to use your muscles for a long time, even after they get tired. Exercise E: Bridge (Hip Extensors)  Lie on your back on a firm surface with your knees bent and your feet flat on the floor. Tighten your buttocks muscles and  lift your bottom off the floor until the trunk of your body is level with your thighs. Do not arch your back. You should feel the muscles working in your buttocks and the back of your thighs. If you do not feel these muscles, slide your feet 1-2 inches (2.5-5 cm) farther away from your buttocks. Hold this position for 3 seconds. Slowly lower your hips to the starting position. Repeat for a total of 10 repetitions. Let your muscles relax completely between repetitions. If this exercise is too easy, try doing it with your arms crossed over your chest. Repeat 2 times. Complete this exercise 3 times per week. Exercise F: Straight Leg Raises - Hip Abductors  Lie on your side with your left / right leg in the top position. Lie so your head, shoulder, knee, and hip line up with each other. You may bend your bottom knee to help you balance. Roll your hips slightly forward, so your hips are stacked directly over each other and your left / right knee is facing forward. Leading with your heel, lift your top leg 4-6 inches (10-15 cm). You should feel the muscles in your outer hip lifting. Do not let your foot drift forward. Do not let your knee roll toward the ceiling. Hold this position for 1 second. Slowly return to the starting position. Let your muscles relax completely between repetitions. Repeat for a total of 10 repetitions.  Repeat 2 times. Complete this exercise 3 times per week. Exercise G: Straight Leg Raises - Hip Adductors  Lie on your side with your left / right leg in the bottom position. Lie so your head, shoulder, knee, and hip line up. You may place your upper foot in front to help you balance. Roll your hips slightly forward, so your hips are stacked directly over each other and your left / right knee is facing forward. Tense the muscles in your inner thigh and lift your bottom leg 4-6 inches (10-15 cm). Hold this position for 1 second. Slowly return to the starting position. Let  your muscles relax completely between repetitions. Repeat for a total of 10 repetitions. Repeat 2 times. Complete this exercise 3 times per week. Exercise H: Straight Leg Raises - Quadriceps  Lie on your back with your left / right leg extended and your other knee bent. Tense the muscles in the front of your left / right thigh. When you do this, you should see your kneecap slide up or see increased dimpling just above your knee. Tighten these muscles even more and raise your leg 4-6 inches (10-15 cm) off the floor. Hold this position for 3 seconds. Keep these muscles tense as you lower your leg. Relax the muscles slowly and completely between repetitions. Repeat for a total of 10 repetitions. Repeat 2 times. Complete this exercise 3 times per week. Exercise I: Hip Abductors, Standing Tie one  end of a rubber exercise band or tubing to a secure surface, such as a table or pole. Loop the other end of the band or tubing around your left / right ankle. Keeping your ankle with the band or tubing directly opposite of the secured end, step away until there is tension in the tubing or band. Hold onto a chair as needed for balance. Lift your left / right leg out to your side. While you do this: Keep your back upright. Keep your shoulders over your hips. Keep your toes pointing forward. Make sure to use your hip muscles to lift your leg. Do not "throw" your leg or tip your body to lift your leg. Hold this position for 1 second. Slowly return to the starting position. Repeat for a total of 10 repetitions. Repeat 2 times. Complete this exercise 3 times per week. Exercise J: Squats (Quadriceps) Stand in a door frame so your feet and knees are in line with the frame. You may place your hands on the frame for balance. Slowly bend your knees and lower your hips like you are going to sit in a chair. Keep your lower legs in a straight-up-and-down position. Do not let your hips go lower than your knees. Do  not bend your knees lower than told by your health care provider. If your hip pain increases, do not bend as low. Hold this position for 1 second. Slowly push with your legs to return to standing. Do not use your hands to pull yourself to standing. Repeat for a total of 10 repetitions. Repeat 2 times. Complete this exercise 3 times per week. Make sure you discuss any questions you have with your health care provider. Document Released: 06/19/2005 Document Revised: 02/24/2016 Document Reviewed: 05/27/2015 Elsevier Interactive Patient Education  [employer].

## 2023-10-25 NOTE — Progress Notes (Signed)
 Musculoskeletal Exam  Patient: Austin Boyd DOB: 1966/01/27  DOS: 10/25/2023  SUBJECTIVE:  Chief Complaint:   Chief Complaint  Patient presents with   Hip Pain    Right Hip Pain    Austin Boyd is a 58 y.o.  male for evaluation and treatment of hip pain.   Onset:  6 months ago. No inj or change in activity.  Comes and goes.  Location: R groin area Character:  throbbing  Progression of issue:  has worsened Associated symptoms: radiates down anterior thigh No catching/locking, bruising, redness, swelling, lumps, testicular pain.  Treatment: to date has been home exercises.   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 No acute distress. Thorax & Lungs: No accessory muscle use Musculoskeletal: R hip.   Normal active range of motion: yes.   Normal passive range of motion: yes Tenderness to palpation: no Deformity: no Tight glutes on the right Ecchymosis: no Tests positive: Stinchfield Tests negative: FABER, FADIR, logroll Neurologic: Normal sensory function. Gait nml.  Psychiatric: Normal mood. Age appropriate judgment and insight. Alert & oriented x 3.    Assessment:  Right hip pain  Plan: Stretches/exercises, heat, ice, Tylenol .  I will check an x-ray to rule out hip arthritis.  He will check with insurance to see where he is able to get this.  Offered physical  therapy but he would like to see what the results of the x-rays first which I think is reasonable. F/u as originally scheduled. The patient voiced understanding and agreement to the plan.   Shellie Dials [place], DO 10/25/23  8:37 AM

## 2023-11-17 ENCOUNTER — Other Ambulatory Visit: Payer: Self-pay | Admitting: Family Medicine

## 2023-11-17 DIAGNOSIS — I10 Essential (primary) hypertension: Secondary | ICD-10-CM

## 2024-01-10 ENCOUNTER — Ambulatory Visit: Admitting: Family Medicine

## 2024-01-10 ENCOUNTER — Telehealth: Payer: Self-pay

## 2024-01-10 ENCOUNTER — Encounter: Payer: Self-pay | Admitting: Family Medicine

## 2024-01-10 VITALS — BP 134/80 | HR 82 | Temp 98.0°F | Resp 16 | Ht 66.0 in | Wt 183.0 lb

## 2024-01-10 DIAGNOSIS — J209 Acute bronchitis, unspecified: Secondary | ICD-10-CM | POA: Diagnosis not present

## 2024-01-10 DIAGNOSIS — D171 Benign lipomatous neoplasm of skin and subcutaneous tissue of trunk: Secondary | ICD-10-CM

## 2024-01-10 MED ORDER — AZITHROMYCIN 250 MG PO TABS
ORAL_TABLET | ORAL | 0 refills | Status: AC
Start: 2024-01-10 — End: ?

## 2024-01-10 MED ORDER — PREDNISONE 20 MG PO TABS: 40.0000 mg | ORAL_TABLET | Freq: Every day | ORAL | 0 refills | Status: AC

## 2024-01-10 NOTE — Telephone Encounter (Signed)
 Initial Comment Caller states he had an appt at 130 and she wanted him to talk to us  before he went in. He is having a lot of congestion and coughing. not coughing up blood Translation No Nurse Assessment Nurse: Parry, RN, Waddell Date/Time Titus Time): 01/10/2024 9:44:17 AM Confirm and document reason for call. If symptomatic, describe symptoms. ---Cough and congestion started on Thursday, Wheezing present. Denies fever. Does the patient have any new or worsening symptoms? ---Yes Will a triage be completed? ---Yes Related visit to physician within the last 2 weeks? ---No Does the PT have any chronic conditions? (i.e. diabetes, asthma, this includes High risk factors for pregnancy, etc.) ---Yes List chronic conditions. ---HTN Is this a behavioral health or substance abuse call? ---No Guidelines Guideline Title Affirmed Question Affirmed Notes Nurse Date/Time (Eastern Time) Cough - Acute NonProductive [1] MILD difficulty breathing (e.g., minimal/no SOB at rest, SOB with walking, pulse < 100) AND [2] still present when not coughing Parry OBIE Waddell 01/10/2024 9:44:54 AM PLEASE NOTE: All timestamps contained within this report are represented as [place] Standard Time. CONFIDENTIALTY NOTICE: This fax transmission is intended only for the addressee. It contains information that is legally privileged, confidential or otherwise protected from use or disclosure. If you are not the intended recipient, you are strictly prohibited from reviewing, disclosing, copying using or disseminating any of this information or taking any action in reliance on or regarding this information. If you have received this fax in error, please notify us  immediately by telephone so that we can arrange for its return to us . Phone: [phone], Toll-Free: [phone], Fax: [phone] MICHAEL_CORNETTE February 20, 1966 Page: 1 of2 CallId: [id number] Disp. Time Titus Time) Disposition Final User 01/10/2024  9:33:55 AM Attempt made - message left Parry OBIE Waddell 01/10/2024 9:46:59 AM See HCP (or PCP Triage) Within 4 Hours Yes Parry, RN, Waddell Final Disposition 01/10/2024 9:46:59 AM See HCP (or PCP Triage) Within 4 Hours Yes Parry, RN, Waddell [place] Disagree/Comply Comply Caller Understands Yes PreDisposition Did not know what to do Care Advice Given Per Guideline * IF OFFICE WILL BE OPEN: You need to be seen within the next 3 or 4 hours. Call your doctor (or NP/PA) now or as soon as the office opens. SEE HCP (OR PCP TRIAGE) WITHIN 4 HOURS: CALL BACK IF: Comments User: Waddell Parry, RN Date/Time (Eastern Time): 01/10/2024 9:47:44 AM Pt has an appt today 1:30pm. Referrals REFERRED TO PCP OFFICE

## 2024-01-10 NOTE — Progress Notes (Signed)
 Chief Complaint  Patient presents with   Wheezing    Wheezing    Austin Boyd here for URI complaints.  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   Hypertension    NAFLD (nonalcoholic fatty liver disease)    [place]  2016   PONV (postoperative nausea and vomiting)    Thyroid  disease    Wears contact lenses     Objective BP 134/80 (BP Location: Left Arm, Patient Position: Sitting)   Pulse 82   Temp 98 F (36.7 C) (Oral)   Resp 16   Ht 5' 6 (1.676 m)   Wt 183 lb (83 kg)   SpO2 100%   BMI 29.54 kg/m  General: Awake, alert, appears stated age HEENT: AT, [place], ears patent b/l and TM's neg, nares patent w/o discharge, pharynx pink and without exudates, MMM, no sinus ttp Neck: No masses or asymmetry Heart: RRR Lungs: CTAB on the R, coarse breath sounds on the R, no accessory muscle use Psych: Age appropriate judgment and insight, normal mood and affect  Acute wheezy bronchitis - Plan: azithromycin  (ZITHROMAX ) 250 MG tablet, predniSONE  (DELTASONE ) 20 MG tablet  Lipoma of back - Plan: Ambulatory referral to General Surgery  Given exam findings, will tx w Zpak to cover for possible infection and 5 d pred 40 mg/d for wheezing. Continue to push fluids, practice good hand hygiene, cover mouth when coughing. F/u prn. If starting to experience fevers, shaking, or shortness of breath, seek immediate care. Pt voiced understanding and agreement to the plan.  Mabel [place], DO 01/10/24 1:33 PM

## 2024-01-10 NOTE — Patient Instructions (Addendum)
 Continue to push fluids, practice good hand hygiene, and cover your mouth if you cough.  If you start having fevers, shaking or shortness of breath, seek immediate care.  OK to take Tylenol 1000 mg (2 extra strength tabs) or 975 mg (3 regular strength tabs) every 6 hours as needed.  If you do not hear anything about your referral in the next 1-2 weeks, call our office and ask for an update.  Let us know if you need anything.

## 2024-02-03 ENCOUNTER — Other Ambulatory Visit: Payer: Self-pay | Admitting: Family Medicine

## 2024-02-03 DIAGNOSIS — I10 Essential (primary) hypertension: Secondary | ICD-10-CM

## 2024-02-04 ENCOUNTER — Ambulatory Visit: Admitting: Family Medicine

## 2024-02-04 ENCOUNTER — Encounter: Payer: Self-pay | Admitting: Family Medicine

## 2024-02-04 VITALS — BP 130/84 | HR 78 | Temp 98.0°F | Resp 16 | Ht 66.0 in | Wt 183.2 lb

## 2024-02-04 DIAGNOSIS — L03115 Cellulitis of right lower limb: Secondary | ICD-10-CM

## 2024-02-04 MED ORDER — DOXYCYCLINE HYCLATE 100 MG PO TABS: 100.0000 mg | ORAL_TABLET | Freq: Two times a day (BID) | ORAL | 0 refills | Status: AC

## 2024-02-04 NOTE — Progress Notes (Signed)
 Chief Complaint  Patient presents with   Insect Bite    Insect Bite    Austin Boyd is a 58 y.o. male here for a skin complaint.  Duration: 12 days Location: RLE Pruritic? Itched at first; then stopped when the pain stopped Painful? Yes- started 5 d ago Drainage? No New soaps/lotions/topicals/detergents? No Trauma? 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 No drainage, fluctuance, excoriation Psych: Age appropriate judgment and insight  Cellulitis of right lower extremity - Plan: doxycycline  (VIBRA -TABS) 100 MG tablet  7-10 d of doxycycline  100 mg twice daily. Warning signs and symptoms verbalized and written down in AVS. Tylenol , ice as needed. F/u prn. The patient voiced understanding and agreement to the plan.  Mabel [place], DO 02/04/24 11:48 AM

## 2024-02-04 NOTE — Patient Instructions (Signed)
Ice/cold pack over area for 10-15 min twice daily.  OK to take Tylenol 1000 mg (2 extra strength tabs) or 975 mg (3 regular strength tabs) every 6 hours as needed.  When you do wash it, use only soap and water. Do not vigorously scrub. Keep the area clean and dry.   Things to look out for: increasing pain not relieved by ibuprofen/acetaminophen, fevers, spreading redness, drainage of pus, or foul odor.  Let us know if you need anything.

## 2024-05-04 ENCOUNTER — Other Ambulatory Visit: Payer: Self-pay | Admitting: Family Medicine

## 2024-05-22 DIAGNOSIS — D171 Benign lipomatous neoplasm of skin and subcutaneous tissue of trunk: Secondary | ICD-10-CM | POA: Diagnosis not present

## 2024-07-02 ENCOUNTER — Encounter: Payer: Self-pay | Admitting: Family Medicine

## 2024-07-03 ENCOUNTER — Other Ambulatory Visit: Payer: Self-pay

## 2024-07-03 MED ORDER — SILDENAFIL CITRATE 100 MG PO TABS: ORAL_TABLET | ORAL | 11 refills | Status: AC
